# Patient Record
Sex: Male | Born: 1980 | Race: White | Hispanic: No | Marital: Married | Smoking: Former smoker
Health system: Southern US, Community
[De-identification: ages and names within clinical notes are randomized; demographics above are authoritative.]

## PROBLEM LIST (undated history)

## (undated) DIAGNOSIS — R109 Unspecified abdominal pain: Secondary | ICD-10-CM

## (undated) DIAGNOSIS — F419 Anxiety disorder, unspecified: Secondary | ICD-10-CM

## (undated) DIAGNOSIS — R51 Headache: Secondary | ICD-10-CM

## (undated) DIAGNOSIS — F329 Major depressive disorder, single episode, unspecified: Secondary | ICD-10-CM

## (undated) DIAGNOSIS — G8929 Other chronic pain: Secondary | ICD-10-CM

## (undated) DIAGNOSIS — R079 Chest pain, unspecified: Secondary | ICD-10-CM

## (undated) DIAGNOSIS — N200 Calculus of kidney: Secondary | ICD-10-CM

## (undated) DIAGNOSIS — R202 Paresthesia of skin: Secondary | ICD-10-CM

## (undated) DIAGNOSIS — R519 Headache, unspecified: Secondary | ICD-10-CM

## (undated) DIAGNOSIS — I10 Essential (primary) hypertension: Secondary | ICD-10-CM

## (undated) DIAGNOSIS — F32A Depression, unspecified: Secondary | ICD-10-CM

## (undated) DIAGNOSIS — K219 Gastro-esophageal reflux disease without esophagitis: Secondary | ICD-10-CM

## (undated) HISTORY — DX: Calculus of kidney: N20.0

## (undated) HISTORY — DX: Essential (primary) hypertension: I10

---

## 2013-05-09 DIAGNOSIS — R079 Chest pain, unspecified: Secondary | ICD-10-CM

## 2013-05-23 DIAGNOSIS — R079 Chest pain, unspecified: Secondary | ICD-10-CM

## 2013-05-28 ENCOUNTER — Emergency Department (HOSPITAL_COMMUNITY): Payer: BC Managed Care – PPO

## 2013-05-28 ENCOUNTER — Encounter (HOSPITAL_COMMUNITY): Payer: Self-pay

## 2013-05-28 ENCOUNTER — Emergency Department (HOSPITAL_COMMUNITY)
Admission: EM | Admit: 2013-05-28 | Discharge: 2013-05-29 | Disposition: A | Discharge status: Home / Self Care | Payer: BC Managed Care – PPO | Attending: Emergency Medicine | Admitting: Emergency Medicine

## 2013-05-28 DIAGNOSIS — F419 Anxiety disorder, unspecified: Secondary | ICD-10-CM

## 2013-05-28 DIAGNOSIS — Z791 Long term (current) use of non-steroidal anti-inflammatories (NSAID): Secondary | ICD-10-CM | POA: Insufficient documentation

## 2013-05-28 DIAGNOSIS — Z79899 Other long term (current) drug therapy: Secondary | ICD-10-CM | POA: Insufficient documentation

## 2013-05-28 DIAGNOSIS — Z7982 Long term (current) use of aspirin: Secondary | ICD-10-CM | POA: Insufficient documentation

## 2013-05-28 DIAGNOSIS — F411 Generalized anxiety disorder: Secondary | ICD-10-CM | POA: Insufficient documentation

## 2013-05-28 DIAGNOSIS — F17200 Nicotine dependence, unspecified, uncomplicated: Secondary | ICD-10-CM | POA: Insufficient documentation

## 2013-05-28 LAB — CBC
HCT: 47 % (ref 39.0–52.0)
Hemoglobin: 16.5 g/dL (ref 13.0–17.0)
MCH: 30.7 pg (ref 26.0–34.0)
MCV: 87.4 fL (ref 78.0–100.0)
Platelets: 263 10*3/uL (ref 150–400)
RBC: 5.38 MIL/uL (ref 4.22–5.81)

## 2013-05-28 LAB — BASIC METABOLIC PANEL
BUN: 19 mg/dL (ref 6–23)
CO2: 27 mEq/L (ref 19–32)
Calcium: 9.4 mg/dL (ref 8.4–10.5)
Chloride: 105 mEq/L (ref 96–112)
Creatinine, Ser: 0.96 mg/dL (ref 0.50–1.35)
Glucose, Bld: 115 mg/dL — ABNORMAL HIGH (ref 70–99)

## 2013-05-28 MED ORDER — LORAZEPAM 1 MG PO TABS
1.0000 mg | ORAL_TABLET | Freq: Once | ORAL | Status: AC
  Administered 2013-05-28: 1 mg via ORAL
  Filled 2013-05-28: qty 1

## 2013-05-28 MED ORDER — LORAZEPAM 1 MG PO TABS: 0.5000 mg | ORAL_TABLET | Freq: Three times a day (TID) | ORAL | Status: DC | PRN

## 2013-05-28 NOTE — ED Notes (Signed)
Pt c/o generalized chest pain x1 month, pt reports the pain has now moved to his Left chest and radiates through to his back. Pt denies N/V, cough, congestion, diaphoresis, SOB, lightheaded, or weakness. Pt has been at [facility] several times for the same and has had a full work up and a stress test done on Friday is waiting for the results from his stress test

## 2013-05-28 NOTE — ED Provider Notes (Signed)
CSN: [id number]     Arrival date & time 05/28/13  1847 History     First MD Initiated Contact with Patient 05/28/13 2200     Chief Complaint  Patient presents with  . Chest Pain   (Consider location/radiation/quality/duration/timing/severity/associated sxs/prior Treatment) HPI  Marco Martinez is a 32 y.o.male without any significant PMH presents to the ER with complaints of intermittent chest pains. The pains have been going on for 1 months and are always changing location. The common the last couple of seconds and go away. He has been seen at [facility] multiple times for the same and they keep telling him he has acid reflux. A couple days ago they admitted him and did a stress test but he has not gotten the results back. He supposed to get back tomorrow. He decided to come to [facility] today because he feels as though Marco Martinez is not giving him any information. After speaking with the patient he admits that he is under a severe amount of stress. His father has died, his mom is sick, he's got a new baby in the wife to take care of, his foot hurts, he works a lot. The patient says sometimes he gets heart palpitations when he gets really anxious about everything. He does not feel like he has anyone to talk to. Denies being homicidal or suicidal. He admits that the pain calms mainly when he's laying down to go to sleep and starts thinking about things. He denies nausea, vomiting, diarrhea, congestion, wheezing, shortness of breath, weakness, dizziness.   History reviewed. No pertinent past medical history. History reviewed. No pertinent past surgical history. History reviewed. No pertinent family history. History  Substance Use Topics  . Smoking status: Current Every Day Smoker  . Smokeless tobacco: Not on file  . Alcohol Use: No    Review of Systems ROS is negative unless otherwise stated in the HPI  Allergies  Review of patient's allergies indicates no known allergies.  Home  Medications   Current Outpatient Rx  Name  Route  Sig  Dispense  Refill  . aspirin 325 MG tablet   Oral   Take 325 mg by mouth daily.         Marland Kitchen aspirin 81 MG tablet   Oral   Take 81 mg by mouth once.          . naproxen sodium (ANAPROX) 220 MG tablet   Oral   Take 440 mg by mouth daily as needed (for pain).         . nicotine (NICODERM CQ - DOSED IN MG/24 HOURS) 21 mg/24hr patch   Transdermal   Place 1 patch onto the skin daily.         . nitroGLYCERIN (NITROGLYN) 2 % ointment   Transdermal   Place 0.5 inches onto the skin 3 (three) times daily.         Marland Kitchen omeprazole (PRILOSEC) 40 MG capsule   Oral   Take 40 mg by mouth daily.         Marland Kitchen PRESCRIPTION MEDICATION   Apply externally   Apply 1 patch topically once. Nitro Patch, unknown strength         . meloxicam (MOBIC) 15 MG tablet   Oral   Take 15 mg by mouth daily.          BP 126/74  Pulse 77  Temp(Src) 98.2 F (36.8 C) (Oral)  Resp 21  Ht 6\' 1"  (1.854 m)  Wt 249 lb (  112.946 kg)  BMI 32.86 kg/m2  SpO2 100% Physical Exam  Nursing note and vitals reviewed. Constitutional: He appears well-developed and well-nourished. No distress.  HENT:  Head: Normocephalic and atraumatic.  Eyes: Pupils are equal, round, and reactive to light.  Neck: Normal range of motion. Neck supple.  Cardiovascular: Normal rate and regular rhythm.   Pulmonary/Chest: Effort normal. He exhibits no tenderness, no bony tenderness, no deformity and no swelling.  Abdominal: Soft.  Neurological: He is alert.  Skin: Skin is warm and dry.  Psychiatric: His mood appears anxious. He expresses no homicidal and no suicidal ideation.    ED Course   Procedures (including critical care time)  Labs Reviewed  CBC - Abnormal; Notable for the following:    WBC 10.9 (*)    All other components within normal limits  BASIC METABOLIC PANEL - Abnormal; Notable for the following:    Glucose, Bld 115 (*)    All other components within  normal limits  POCT I-STAT TROPONIN I   Dg Chest 2 View  05/28/2013   *RADIOLOGY REPORT*  Clinical Data: Chest discomfort for 1 month.  Smoker.  CHEST - 2 VIEW  Comparison: 05/23/13 from [facility]  Findings: Lateral view degraded by patient arm position.  Midline trachea.  Normal heart size and mediastinal contours. No pleural effusion or pneumothorax.  Mild low lung volumes. Clear lungs.  IMPRESSION: No acute cardiopulmonary disease.   Original Report Authenticated By: Jeronimo Greaves, M.D.   1. Anxiety     MDM   Date: 05/28/2013  Rate: 85  Rhythm: normal sinus rhythm  QRS Axis: right  Intervals: normal  ST/T Wave abnormalities: normal  Conduction Disutrbances:none  Narrative Interpretation:   Old EKG Reviewed: unchanged  Patient 1 mg of Ativan he said that the sensation in his chest went away. Patient is PERC negative and possible PE was considered but not likely. Pt now feels  that it is probably stress.  Due to patient significant amount of stress and feeling of omeprazole for GERD and recent stress test, I question whether this is anxiety related.  He will call to find out results of the stress test tomorrow. I will give him a referral to cardiology in referrals for a very health resources in [place]. Him and his wife are very comfortable with plan.  Dorthula Matas, PA-C 05/28/13 2356

## 2013-05-29 NOTE — ED Provider Notes (Signed)
Medical screening examination/treatment/procedure(s) were performed by non-physician practitioner and as supervising physician I was immediately available for consultation/collaboration.  Emmaclaire Switala M Makai Dumond, MD 05/29/13 0018 

## 2013-06-15 ENCOUNTER — Encounter: Payer: Self-pay | Admitting: *Deleted

## 2013-06-18 ENCOUNTER — Emergency Department (HOSPITAL_COMMUNITY)
Admission: EM | Admit: 2013-06-18 | Discharge: 2013-06-18 | Disposition: A | Discharge status: Home / Self Care | Payer: BC Managed Care – PPO | Attending: Emergency Medicine | Admitting: Emergency Medicine

## 2013-06-18 ENCOUNTER — Emergency Department (HOSPITAL_COMMUNITY): Payer: BC Managed Care – PPO

## 2013-06-18 ENCOUNTER — Encounter (HOSPITAL_COMMUNITY): Payer: Self-pay | Admitting: *Deleted

## 2013-06-18 DIAGNOSIS — M25579 Pain in unspecified ankle and joints of unspecified foot: Secondary | ICD-10-CM | POA: Insufficient documentation

## 2013-06-18 DIAGNOSIS — Z791 Long term (current) use of non-steroidal anti-inflammatories (NSAID): Secondary | ICD-10-CM | POA: Insufficient documentation

## 2013-06-18 DIAGNOSIS — M542 Cervicalgia: Secondary | ICD-10-CM | POA: Insufficient documentation

## 2013-06-18 DIAGNOSIS — R209 Unspecified disturbances of skin sensation: Secondary | ICD-10-CM | POA: Insufficient documentation

## 2013-06-18 DIAGNOSIS — Z7982 Long term (current) use of aspirin: Secondary | ICD-10-CM | POA: Insufficient documentation

## 2013-06-18 DIAGNOSIS — Z87828 Personal history of other (healed) physical injury and trauma: Secondary | ICD-10-CM | POA: Insufficient documentation

## 2013-06-18 DIAGNOSIS — F17200 Nicotine dependence, unspecified, uncomplicated: Secondary | ICD-10-CM | POA: Insufficient documentation

## 2013-06-18 DIAGNOSIS — M25561 Pain in right knee: Secondary | ICD-10-CM

## 2013-06-18 DIAGNOSIS — M25569 Pain in unspecified knee: Secondary | ICD-10-CM | POA: Insufficient documentation

## 2013-06-18 DIAGNOSIS — Z79899 Other long term (current) drug therapy: Secondary | ICD-10-CM | POA: Insufficient documentation

## 2013-06-18 DIAGNOSIS — R6889 Other general symptoms and signs: Secondary | ICD-10-CM | POA: Insufficient documentation

## 2013-06-18 MED ORDER — TRAMADOL HCL 50 MG PO TABS: 50.0000 mg | ORAL_TABLET | Freq: Four times a day (QID) | ORAL | Status: DC | PRN
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
## 2013-06-18 NOTE — ED Notes (Signed)
Pt alert & oriented x4, stable gait. Patient given discharge instructions, paperwork & prescription(s). Patient  instructed to stop at the registration desk to finish any additional paperwork. Patient verbalized understanding. Pt left department w/ no further questions. 
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
## 2013-06-18 NOTE — ED Notes (Addendum)
Pain lt chest for several mos and has had stress tests , ekg, blood tests and could not find anything wrong. Started  ativan for anxiety. And med for reflux and aspirin.  For last 2 days has had numb sensation in back of neck and in rt leg. And back of head.

## 2013-06-22 ENCOUNTER — Encounter: Payer: Self-pay | Admitting: Cardiology

## 2013-06-22 ENCOUNTER — Emergency Department (HOSPITAL_COMMUNITY)
Admission: EM | Admit: 2013-06-22 | Discharge: 2013-06-22 | Disposition: A | Discharge status: Home / Self Care | Payer: BC Managed Care – PPO | Attending: Emergency Medicine | Admitting: Emergency Medicine

## 2013-06-22 ENCOUNTER — Encounter (HOSPITAL_COMMUNITY): Payer: Self-pay | Admitting: *Deleted

## 2013-06-22 ENCOUNTER — Other Ambulatory Visit: Payer: Self-pay

## 2013-06-22 DIAGNOSIS — Z7982 Long term (current) use of aspirin: Secondary | ICD-10-CM | POA: Insufficient documentation

## 2013-06-22 DIAGNOSIS — Z791 Long term (current) use of non-steroidal anti-inflammatories (NSAID): Secondary | ICD-10-CM | POA: Insufficient documentation

## 2013-06-22 DIAGNOSIS — Z79899 Other long term (current) drug therapy: Secondary | ICD-10-CM | POA: Insufficient documentation

## 2013-06-22 DIAGNOSIS — F411 Generalized anxiety disorder: Secondary | ICD-10-CM | POA: Insufficient documentation

## 2013-06-22 DIAGNOSIS — K219 Gastro-esophageal reflux disease without esophagitis: Secondary | ICD-10-CM | POA: Insufficient documentation

## 2013-06-22 DIAGNOSIS — F17200 Nicotine dependence, unspecified, uncomplicated: Secondary | ICD-10-CM | POA: Insufficient documentation

## 2013-06-22 DIAGNOSIS — F419 Anxiety disorder, unspecified: Secondary | ICD-10-CM

## 2013-06-22 DIAGNOSIS — M549 Dorsalgia, unspecified: Secondary | ICD-10-CM | POA: Insufficient documentation

## 2013-06-22 MED ORDER — LORAZEPAM 1 MG PO TABS: 1.0000 mg | ORAL_TABLET | Freq: Two times a day (BID) | ORAL | Status: DC

## 2013-06-22 MED ORDER — LORAZEPAM 1 MG PO TABS
1.0000 mg | ORAL_TABLET | Freq: Once | ORAL | Status: AC
  Administered 2013-06-22: 1 mg via ORAL
  Filled 2013-06-22: qty 1
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
## 2013-06-22 NOTE — ED Notes (Signed)
Patient presents with c/o intermittent chest and back pain x 2 months. Describes chest pain as a burning sensation. Reports that he has been seen for this several times over the past 2 months by various providers for the same. Cardiac origin of CP has been ruled out through blood work and a negative stress test. Patient was told that the cause of his pain was d/t reflux and depression. Omeprazole and zantac prescribed by PCP. [facility] prescribed Prozac which he just started today. Patient had been on Lorazepam as well but d/c'd by mental health and replaced with Hydroxyzine. Patient states that the combination of omeprazole, zantac and lorazepam relieved the pain. Since being off the lorazepam, the pain has returned. Patient states the pain is intermittent and episodic; only lasts for a few minutes. Denies SOB, diaphoresis, dizziness, headache, nausea, vomiting, abdominal pain or urinary symptoms. Patient AAOx4, resp e/u, VSS and in NAD.

## 2013-07-02 ENCOUNTER — Encounter: Payer: Self-pay | Admitting: Cardiology

## 2013-07-02 DIAGNOSIS — R079 Chest pain, unspecified: Secondary | ICD-10-CM

## 2013-07-07 ENCOUNTER — Encounter (HOSPITAL_COMMUNITY): Payer: Self-pay | Admitting: *Deleted

## 2013-07-07 ENCOUNTER — Emergency Department (HOSPITAL_COMMUNITY)
Admission: EM | Admit: 2013-07-07 | Discharge: 2013-07-07 | Disposition: A | Discharge status: Home / Self Care | Payer: BC Managed Care – PPO | Attending: Emergency Medicine | Admitting: Emergency Medicine

## 2013-07-07 DIAGNOSIS — R079 Chest pain, unspecified: Secondary | ICD-10-CM | POA: Insufficient documentation

## 2013-07-07 DIAGNOSIS — R1011 Right upper quadrant pain: Secondary | ICD-10-CM | POA: Insufficient documentation

## 2013-07-07 DIAGNOSIS — R109 Unspecified abdominal pain: Secondary | ICD-10-CM

## 2013-07-07 DIAGNOSIS — Z7982 Long term (current) use of aspirin: Secondary | ICD-10-CM | POA: Insufficient documentation

## 2013-07-07 DIAGNOSIS — F17200 Nicotine dependence, unspecified, uncomplicated: Secondary | ICD-10-CM | POA: Insufficient documentation

## 2013-07-07 DIAGNOSIS — R0789 Other chest pain: Secondary | ICD-10-CM

## 2013-07-07 DIAGNOSIS — Z79899 Other long term (current) drug therapy: Secondary | ICD-10-CM | POA: Insufficient documentation

## 2013-07-07 DIAGNOSIS — K219 Gastro-esophageal reflux disease without esophagitis: Secondary | ICD-10-CM | POA: Insufficient documentation

## 2013-07-07 DIAGNOSIS — F411 Generalized anxiety disorder: Secondary | ICD-10-CM | POA: Insufficient documentation

## 2013-07-07 DIAGNOSIS — F3289 Other specified depressive episodes: Secondary | ICD-10-CM | POA: Insufficient documentation

## 2013-07-07 DIAGNOSIS — R197 Diarrhea, unspecified: Secondary | ICD-10-CM | POA: Insufficient documentation

## 2013-07-07 DIAGNOSIS — M549 Dorsalgia, unspecified: Secondary | ICD-10-CM | POA: Insufficient documentation

## 2013-07-07 DIAGNOSIS — F329 Major depressive disorder, single episode, unspecified: Secondary | ICD-10-CM | POA: Insufficient documentation

## 2013-07-07 HISTORY — DX: Depression, unspecified: F32.A

## 2013-07-07 HISTORY — DX: Major depressive disorder, single episode, unspecified: F32.9

## 2013-07-07 HISTORY — DX: Gastro-esophageal reflux disease without esophagitis: K21.9

## 2013-07-07 HISTORY — DX: Anxiety disorder, unspecified: F41.9

## 2013-07-07 LAB — URINALYSIS, ROUTINE W REFLEX MICROSCOPIC
Nitrite: NEGATIVE
Specific Gravity, Urine: 1.02 (ref 1.005–1.030)
Urobilinogen, UA: 0.2 mg/dL (ref 0.0–1.0)

## 2013-07-07 LAB — COMPREHENSIVE METABOLIC PANEL
Alkaline Phosphatase: 59 U/L (ref 39–117)
BUN: 21 mg/dL (ref 6–23)
Calcium: 9.3 mg/dL (ref 8.4–10.5)
Creatinine, Ser: 1.02 mg/dL (ref 0.50–1.35)
GFR calc Af Amer: >90 mL/min (ref >90)
Glucose, Bld: 126 mg/dL — ABNORMAL HIGH (ref 70–99)
Total Protein: 7.3 g/dL (ref 6.0–8.3)

## 2013-07-07 LAB — CBC
HCT: 45.1 % (ref 39.0–52.0)
Hemoglobin: 15.5 g/dL (ref 13.0–17.0)
MCH: 29.5 pg (ref 26.0–34.0)
MCHC: 34.4 g/dL (ref 30.0–36.0)

## 2013-07-07 LAB — LIPASE, BLOOD: Lipase: 35 U/L (ref 11–59)

## 2013-07-07 MED ORDER — FAMOTIDINE 20 MG PO TABS
20.0000 mg | ORAL_TABLET | Freq: Once | ORAL | Status: AC
  Administered 2013-07-07: 20 mg via ORAL
  Filled 2013-07-07: qty 1

## 2013-07-07 MED ORDER — RANITIDINE HCL 150 MG PO CAPS: 150.0000 mg | ORAL_CAPSULE | Freq: Every day | ORAL | Status: DC

## 2013-07-07 NOTE — ED Notes (Signed)
Intermittent RUQ pain radiating to back w/increased belching.  C/O L chest burning.

## 2013-07-07 NOTE — ED Provider Notes (Signed)
CSN: [id number]     Arrival date & time 07/07/13  1744 History  This chart was scribed for Audree Camel, MD by Henri Medal, ED Scribe. This patient was seen in room APA16A/APA16A and the patient's care was started at 6:17 PM.    Chief Complaint  Patient presents with  . Abdominal Pain  . Chest Pain   The history is provided by the patient. No language interpreter was used.   HPI Comments: Marco Martinez is a 32 y.o. male who presents to the Emergency Department complaining of intemittent, "burniing" RUQ abdominal and chest pain over the past 3 months. He also reports associated increased belching, and radiation of abdominal pain to his right ribs as an "aching" pain and to his right back as a "stabbing" pain onset 2 days ago. He states that his pain worsened after drinking Sprite yesterday, and again after drinking orange soda today. Pt states that he was admitted last week for a flare-up of this pain onset after eating a hot dog. He states that he has had normal EKGs and normal stress tests, and he does not believe his pain is related to his heart. Pt has taken both Nexium and Prilosec without relief. Pt has taken milk of Mg which he believes may have caused diarrhea, but he also states this may be related to recent Penicillin use. Pt denies fever, chills, leg pain and swelling, nausea, emesis, constipation, dysuria, cough, SOB or any other symptoms. Pt reports he had a mild cold last week.    Past Medical History  Diagnosis Date  . GERD (gastroesophageal reflux disease)   . Anxiety   . Depression    Past Surgical History  Procedure Laterality Date  . Skin surgery on right foot     Family History  Problem Relation Age of Onset  . Bladder Cancer Father   . Hypertension Mother    History  Substance Use Topics  . Smoking status: Current Every Day Smoker -- 0.50 packs/day for 5 years    Types: Cigarettes  . Smokeless tobacco: Not on file  . Alcohol Use: No    Review of Systems   Constitutional: Negative for fever and chills.  Respiratory: Negative for cough and shortness of breath.   Cardiovascular: Positive for chest pain. Negative for leg swelling.  Gastrointestinal: Positive for abdominal pain and diarrhea. Negative for nausea, vomiting and constipation.  Genitourinary: Negative for dysuria.  Musculoskeletal: Positive for back pain.  All other systems reviewed and are negative.    Allergies  Review of patient's allergies indicates no known allergies.  Home Medications   Current Outpatient Rx  Name  Route  Sig  Dispense  Refill  . aspirin 325 MG tablet   Oral   Take 325 mg by mouth daily.         Marland Kitchen aspirin 81 MG tablet   Oral   Take 81 mg by mouth once.          Marland Kitchen FLUoxetine (PROZAC) 20 MG capsule   Oral   Take 20 mg by mouth daily.         . hydrOXYzine (VISTARIL) 25 MG capsule   Oral   Take 25 mg by mouth 3 (three) times daily as needed for itching.         Marland Kitchen LORazepam (ATIVAN) 1 MG tablet   Oral   Take 0.5-1 tablets (0.5-1 mg total) by mouth every 8 (eight) hours as needed for anxiety.   15 tablet  0   . LORazepam (ATIVAN) 1 MG tablet   Oral   Take 1 tablet (1 mg total) by mouth 2 (two) times daily.   30 tablet   0   . meloxicam (MOBIC) 15 MG tablet   Oral   Take 15 mg by mouth daily.         . naproxen sodium (ANAPROX) 220 MG tablet   Oral   Take 440 mg by mouth daily as needed (for pain).         Marland Kitchen omeprazole (PRILOSEC) 40 MG capsule   Oral   Take 40 mg by mouth daily.         . traMADol (ULTRAM) 50 MG tablet   Oral   Take 1 tablet (50 mg total) by mouth every 6 (six) hours as needed for pain.   15 tablet   0    Triage Vitals: BP 137/95  Pulse 70  Temp(Src) 98.8 F (37.1 C) (Oral)  Resp 17  Ht 6\' 1"  (1.854 m)  Wt 229 lb (103.874 kg)  BMI 30.22 kg/m2  SpO2 98%  Physical Exam  Nursing note and vitals reviewed. Constitutional: He is oriented to person, place, and time. He appears well-developed  and well-nourished. No distress.  HENT:  Head: Normocephalic and atraumatic.  Eyes: EOM are normal.  Neck: Neck supple. No tracheal deviation present.  Cardiovascular: Normal rate and regular rhythm.   Pulmonary/Chest: Effort normal and breath sounds normal. No respiratory distress. He exhibits tenderness.  Left anterior chest tenderness.  Abdominal: Soft. Bowel sounds are normal. There is no tenderness.  Genitourinary:  No flank tenderness.  Musculoskeletal: Normal range of motion.  No back or leg tenderness.  Neurological: He is alert and oriented to person, place, and time.  Skin: Skin is warm and dry.  Psychiatric: He has a normal mood and affect. His behavior is normal.    ED Course  Procedures (including critical care time)  DIAGNOSTIC STUDIES: Oxygen Saturation is 98% on room air, normal by my interpretation.    COORDINATION OF CARE: 6:25 PM-Discussed treatment plan which includes UA and CBC to rule out gallbladder disease. Will order Pepcid. Advised pt to try Zantac for relief of symptoms, and that he should not be taking both Nexium and Prilosec. Pt understands and agrees with treatment plan.  Medications  famotidine (PEPCID) tablet 20 mg (20 mg Oral Given 07/07/13 1832)    Labs Review Labs Reviewed  COMPREHENSIVE METABOLIC PANEL - Abnormal; Notable for the following:    Glucose, Bld 126 (*)    All other components within normal limits  URINALYSIS, ROUTINE W REFLEX MICROSCOPIC - Abnormal; Notable for the following:    Bilirubin Urine SMALL (*)    Ketones, ur TRACE (*)    All other components within normal limits  CBC  LIPASE, BLOOD    Date: 07/07/2013  Rate: 73  Rhythm: normal sinus rhythm  QRS Axis: normal  Intervals: normal  ST/T Wave abnormalities: normal  Conduction Disutrbances:none  Narrative Interpretation: Normal EKG  Old EKG Reviewed: unchanged   Imaging Review No results found.  MDM   1. Abdominal pain   2. Burning chest pain    Patient  with recurrent chest pain/burning. This is most c/w a GI etiology, has had extensive cardiac w/u that is neg. EKG unchanged and not concerning. Abd exam benign, patient states his prior RUQ abd pain is gone. LFTs and Lipase normal. Feel he is stable for outpatient w/u of abd pain with possible outpatient  u/s. He is currently taking two PPIs, I recommend he discontinue one and start on an H2 blocker.   I personally performed the services described in this documentation, which was scribed in my presence. The recorded information has been reviewed and is accurate.    Audree Camel, MD 07/08/13 [phone]

## 2013-07-10 ENCOUNTER — Encounter: Payer: Self-pay | Admitting: Cardiology

## 2013-07-16 ENCOUNTER — Ambulatory Visit (INDEPENDENT_AMBULATORY_CARE_PROVIDER_SITE_OTHER): Payer: BC Managed Care – PPO | Admitting: Cardiology

## 2013-07-16 ENCOUNTER — Encounter: Payer: Self-pay | Admitting: Cardiology

## 2013-07-16 VITALS — BP 154/83 | HR 76 | Ht 73.0 in | Wt 228.0 lb

## 2013-07-16 DIAGNOSIS — R072 Precordial pain: Secondary | ICD-10-CM | POA: Insufficient documentation

## 2013-07-16 DIAGNOSIS — F17200 Nicotine dependence, unspecified, uncomplicated: Secondary | ICD-10-CM

## 2013-07-16 DIAGNOSIS — Z720 Tobacco use: Secondary | ICD-10-CM | POA: Insufficient documentation

## 2013-07-16 DIAGNOSIS — R030 Elevated blood-pressure reading, without diagnosis of hypertension: Secondary | ICD-10-CM

## 2013-07-16 NOTE — Assessment & Plan Note (Signed)
Noted on today's visit, no baseline history of hypertension. I recommended that he follow up with primary care provider.

## 2013-07-16 NOTE — Assessment & Plan Note (Signed)
Smoking cessation indicated as well.

## 2013-07-16 NOTE — Patient Instructions (Signed)
Your physician has requested that you have an echocardiogram. Echocardiography is a painless test that uses sound waves to create images of your heart. It provides your doctor with information about the size and shape of your heart and how well your heart's chambers and valves are working. This procedure takes approximately one hour. There are no restrictions for this procedure. Office will contact with results via phone or letter.   Follow up as needed

## 2013-07-16 NOTE — Assessment & Plan Note (Signed)
Very atypical for ischemic cardiac etiology. ECGs reviewed without significant ST segment abnormalities, and recent Lexiscan Cardiolite was low risk without obvious ischemia. Symptoms could be inflammatory based on reported prompt response to Toradol at recent ER visit. Incidentally noted also is wedging of a mid thoracic vertebral body by chest x-ray, question a neuropathic component as well. He does not report any improvement on PPI, also no significant change on Prozac and Ativan. I do not appreciate any pericardial rub on examination - an echocardiogram will be obtained to definitively clarify whether there is any pericardial fluid of significance, or any other cardiac structural abnormalities including assessment of aortic root. If this study is reassuring, no further cardiac testing is planned at this point. I did indicate that he will need close followup with his primary care provider to investigate his symptoms further.

## 2013-07-16 NOTE — Progress Notes (Signed)
Clinical Summary Marco Martinez is a 32 y.o.male referred for cardiology consultation by Dr. Olena Leatherwood. Record review finds hospitalization at [facility] in early September with chest pain in the setting of suspected GERD. He ruled out for myocardial infarction, has been treated with PPI, also Prozac and Ativan.  Recent Lexiscan Cardiolite from August 1 showed no diagnostic ST segment changes, soft tissue attenuation without active ischemia, LVEF 59%. I reviewed the results of this with the patient and his wife today. Also reviewed ECGs - most recent tracing in September showing sinus rhythm with decreased R-wave progression.  Marco Martinez states she has had constant pressure-like and also dull discomfort, varying positions in his chest, over the last 4 months. No exacerbation by exercise. No fevers or chills, no cough. Feels sweaty sometimes with the symptoms. No palpitations or syncope. Of note he was just seen in the ER by Dr. Timmothy Euler on September 15, was treated with Toradol, and tells me that his symptoms completely resolved after this intervention. Starting to return today. Also given a prescription for Lortab. He has not yet seen Dr. Olena Leatherwood for followup.  Chest x-ray report from September 8 showed clear lung fields, normal heart size and pulmonary vascularity. No adenopathy. There is description of a stable mild anterior wedge wedging of a mid thoracic vertebral body.  Today we discussed his symptoms, very atypical for ischemic cardiac chest pain. Also reviewed the low risk Cardiolite results.   No Known Allergies  Current Outpatient Prescriptions  Medication Sig Dispense Refill  . aspirin EC 81 MG tablet Take 81 mg by mouth every other day.       Marland Kitchen HYDROcodone-acetaminophen (NORCO/VICODIN) 5-325 MG per tablet Take 1 tablet by mouth 2 (two) times daily.      Marland Kitchen LORazepam (ATIVAN) 1 MG tablet Take 0.5 mg by mouth as needed.       Marland Kitchen omeprazole (PRILOSEC) 40 MG capsule Take 40 mg by mouth daily.       . ranitidine (ZANTAC) 150 MG capsule Take 1 capsule (150 mg total) by mouth daily.  30 capsule  0   No current facility-administered medications for this visit.    Past Medical History  Diagnosis Date  . GERD (gastroesophageal reflux disease)   . Anxiety   . Depression     Past Surgical History  Procedure Laterality Date  . Skin surgery on right foot      Family History  Problem Relation Age of Onset  . Bladder Cancer Father   . Hypertension Mother     Social History Marco Martinez reports that he has been smoking Cigarettes.  He has a 2.5 pack-year smoking history. He does not have any smokeless tobacco history on file. Marco Martinez reports that he does not drink alcohol.  Review of Systems Negative except as outlined above.  Physical Examination Filed Vitals:   07/16/13 0841  BP: 154/83  Pulse: 76   Filed Weights   07/16/13 0841  Weight: 228 lb (103.42 kg)   Appears comfortable. HEENT: Conjunctiva and lids normal, oropharynx clear. Neck: Supple, no elevated JVP or carotid bruits, no thyromegaly. Lungs: Clear to auscultation, nonlabored breathing at rest. Cardiac: Regular rate and rhythm, no S3 or significant systolic murmur, no pericardial rub. Abdomen: Soft, nontender, no hepatomegaly, bowel sounds present, no guarding or rebound. Extremities: No pitting edema, distal pulses 2+. Skin: Warm and dry. Musculoskeletal: No kyphosis. Neuropsychiatric: Alert and oriented x3, affect grossly appropriate.   Problem List and Plan   Precordial pain Very  atypical for ischemic cardiac etiology. ECGs reviewed without significant ST segment abnormalities, and recent Lexiscan Cardiolite was low risk without obvious ischemia. Symptoms could be inflammatory based on reported prompt response to Toradol at recent ER visit. Incidentally noted also is wedging of a mid thoracic vertebral body by chest x-ray, question a neuropathic component as well. He does not report any improvement on  PPI, also no significant change on Prozac and Ativan. I do not appreciate any pericardial rub on examination - an echocardiogram will be obtained to definitively clarify whether there is any pericardial fluid of significance, or any other cardiac structural abnormalities including assessment of aortic root. If this study is reassuring, no further cardiac testing is planned at this point. I did indicate that he will need close followup with his primary care provider to investigate his symptoms further.  Elevated blood pressure Noted on today's visit, no baseline history of hypertension. I recommended that he follow up with primary care provider.  Tobacco abuse Smoking cessation indicated as well.    Jonelle Sidle, M.D., F.A.C.C.

## 2013-07-19 ENCOUNTER — Ambulatory Visit: Payer: BC Managed Care – PPO | Admitting: Cardiology

## 2013-07-21 ENCOUNTER — Emergency Department (HOSPITAL_COMMUNITY)
Admission: EM | Admit: 2013-07-21 | Discharge: 2013-07-21 | Disposition: A | Discharge status: Home / Self Care | Payer: Medicaid Other | Attending: Emergency Medicine | Admitting: Emergency Medicine

## 2013-07-21 ENCOUNTER — Encounter (HOSPITAL_COMMUNITY): Payer: Self-pay | Admitting: Emergency Medicine

## 2013-07-21 ENCOUNTER — Emergency Department (HOSPITAL_COMMUNITY): Payer: Medicaid Other

## 2013-07-21 DIAGNOSIS — F17200 Nicotine dependence, unspecified, uncomplicated: Secondary | ICD-10-CM | POA: Insufficient documentation

## 2013-07-21 DIAGNOSIS — F3289 Other specified depressive episodes: Secondary | ICD-10-CM | POA: Insufficient documentation

## 2013-07-21 DIAGNOSIS — R519 Headache, unspecified: Secondary | ICD-10-CM

## 2013-07-21 DIAGNOSIS — R0789 Other chest pain: Secondary | ICD-10-CM

## 2013-07-21 DIAGNOSIS — Z79899 Other long term (current) drug therapy: Secondary | ICD-10-CM | POA: Insufficient documentation

## 2013-07-21 DIAGNOSIS — R059 Cough, unspecified: Secondary | ICD-10-CM | POA: Insufficient documentation

## 2013-07-21 DIAGNOSIS — M79609 Pain in unspecified limb: Secondary | ICD-10-CM | POA: Insufficient documentation

## 2013-07-21 DIAGNOSIS — K219 Gastro-esophageal reflux disease without esophagitis: Secondary | ICD-10-CM | POA: Insufficient documentation

## 2013-07-21 DIAGNOSIS — R51 Headache: Secondary | ICD-10-CM | POA: Insufficient documentation

## 2013-07-21 DIAGNOSIS — F329 Major depressive disorder, single episode, unspecified: Secondary | ICD-10-CM | POA: Insufficient documentation

## 2013-07-21 DIAGNOSIS — F411 Generalized anxiety disorder: Secondary | ICD-10-CM | POA: Insufficient documentation

## 2013-07-21 DIAGNOSIS — R05 Cough: Secondary | ICD-10-CM | POA: Insufficient documentation

## 2013-07-21 LAB — CBC WITH DIFFERENTIAL/PLATELET
Basophils Relative: 0 % (ref 0–1)
Eosinophils Absolute: 0.2 10*3/uL (ref 0.0–0.7)
Lymphs Abs: 2.7 10*3/uL (ref 0.7–4.0)
MCH: 30 pg (ref 26.0–34.0)
Neutrophils Relative %: 64 % (ref 43–77)
Platelets: 253 10*3/uL (ref 150–400)
RBC: 4.94 MIL/uL (ref 4.22–5.81)

## 2013-07-21 LAB — COMPREHENSIVE METABOLIC PANEL
ALT: 25 U/L (ref 0–53)
AST: 19 U/L (ref 0–37)
Albumin: 4.2 g/dL (ref 3.5–5.2)
Alkaline Phosphatase: 54 U/L (ref 39–117)
Glucose, Bld: 146 mg/dL — ABNORMAL HIGH (ref 70–99)
Potassium: 3.2 mEq/L — ABNORMAL LOW (ref 3.5–5.1)
Sodium: 137 mEq/L (ref 135–145)
Total Protein: 6.9 g/dL (ref 6.0–8.3)

## 2013-07-21 NOTE — ED Notes (Signed)
Pt c/o headache x 1 month. Started getting numb to back of head x 2 days ago. Denies dizziness/trouble walking or swallowing/blurred vision. Pt c/o pressure to anterior lower right leg x 2 days now. Pt c/o sharp pains that rotates from right to left breast x 2 days now also. Nad. Alert/active.

## 2013-07-21 NOTE — ED Provider Notes (Signed)
Scribed for No att. providers found, the patient was seen in room APA19/APA19. This chart was scribed by Lewanda Rife, ED scribe. Patient's care was started at 1831  CSN: [id number]     Arrival date & time 07/21/13  1820 History   First MD Initiated Contact with Patient 07/21/13 1828     Chief Complaint  Patient presents with  . Numbness   (Consider location/radiation/quality/duration/timing/severity/associated sxs/prior Treatment) The history is provided by the patient.   HPI Comments: Marco Martinez is a 32 y.o. male who presents to the Emergency Department complaining of waxing and waning moderate chest pain onset 5 months. Describes pain as stabbing and episodes last 4-5 minutes over inferior to bilateral breasts. Reports associated occasional non-productive cough. Denies any aggravating or alleviating factors. Denies associated shortness of breath, visual disturbances, dysphagia, and fever. Reports frequent visits to the ED for chest pain, but different location. Negative stress test with cardiologist last week. Taking Norco for pain with mild relief.   Reports normal fluid and food intake.   Additionally, reports of waxing and waning moderate headaches onset gradual each day 1 month. Describes pain as numbness. Denies associated recent falls, and recent head injuries. Denies any aggravating or alleviating factors.   Additionally, reports anterior right lower leg pain onset 2 days. Denies associated recent fall, change in gait, and recent injuries.  Denies any aggravating or alleviating factors.    Past Medical History  Diagnosis Date  . GERD (gastroesophageal reflux disease)   . Anxiety   . Depression    History reviewed. No pertinent past surgical history. Family History  Problem Relation Age of Onset  . Bladder Cancer Father   . Hypertension Mother    History  Substance Use Topics  . Smoking status: Current Every Day Smoker -- 0.50 packs/day for 5 years    Types:  Cigarettes  . Smokeless tobacco: Not on file  . Alcohol Use: No    Review of Systems  Neurological: Positive for numbness.  All other systems reviewed and are negative.  A complete 10 system review of systems was obtained and all systems are negative except as noted in the HPI and PMH.     Allergies  Review of patient's allergies indicates no known allergies.  Home Medications   Current Outpatient Rx  Name  Route  Sig  Dispense  Refill  . aspirin EC 81 MG tablet   Oral   Take 81 mg by mouth every other day.          Marland Kitchen HYDROcodone-acetaminophen (NORCO/VICODIN) 5-325 MG per tablet   Oral   Take 1 tablet by mouth 2 (two) times daily.         Marland Kitchen LORazepam (ATIVAN) 1 MG tablet   Oral   Take 0.5 mg by mouth daily as needed for anxiety.          Marland Kitchen omeprazole (PRILOSEC) 40 MG capsule   Oral   Take 40 mg by mouth daily.         . ranitidine (ZANTAC) 150 MG capsule   Oral   Take 1 capsule (150 mg total) by mouth daily.   30 capsule   0    BP 145/85  Pulse 81  Temp(Src) 99.1 F (37.3 C) (Oral)  Resp 18  SpO2 97% Physical Exam  Nursing note and vitals reviewed. Constitutional: He is oriented to person, place, and time. He appears well-developed and well-nourished. No distress.  HENT:  Head: Normocephalic and atraumatic.  No step-offs,  no hematomas, no deformities.   Eyes: Conjunctivae and EOM are normal. Pupils are equal, round, and reactive to light. No scleral icterus.  Neck: Normal range of motion. Neck supple. No tracheal deviation present.  Cardiovascular: Normal rate, regular rhythm and intact distal pulses.   No murmur heard. Pulmonary/Chest: Effort normal and breath sounds normal. No stridor. No respiratory distress. He has no wheezes. He has no rales. He exhibits no tenderness and no bony tenderness.  Chest pain not reproducible   Abdominal: Soft.  Musculoskeletal: Normal range of motion. He exhibits no edema and no tenderness.       Right knee:  Normal. No tenderness found.  No proximal tibial tenderness.   Neurological: He is alert and oriented to person, place, and time. He has normal strength. No sensory deficit.  Normal gait without ataxia. CN 2-12 intact, no ataxia on finger to nose, no nystagmus, 5/5 strength throughout, no pronator drift, Romberg negative, normal gait.    Skin: Skin is warm and dry.  Psychiatric: He has a normal mood and affect. His behavior is normal.    ED Course  Procedures (including critical care time) Medications - No data to display  Labs Review Labs Reviewed  COMPREHENSIVE METABOLIC PANEL - Abnormal; Notable for the following:    Potassium 3.2 (*)    Glucose, Bld 146 (*)    All other components within normal limits  CBC WITH DIFFERENTIAL  TROPONIN I  D-DIMER, QUANTITATIVE   Imaging Review Dg Chest 2 View  07/21/2013   CLINICAL DATA:  Chest pain, leg pain  EXAM: CHEST  2 VIEW  COMPARISON:  07/02/2013  FINDINGS: Upper-normal size of cardiac silhouette.  Mediastinal contours and pulmonary vascularity normal.  Minimal peribronchial thickening.  Lungs otherwise clear.  No pleural effusion or pneumothorax.  Bones stable with mild chronic anterior height loss of a lower thoracic vertebra.  IMPRESSION: Minimal bronchitic changes without infiltrate.   Electronically Signed   By: Ulyses Southward M.D.   On: 07/21/2013 19:13   Dg Tibia/fibula Right  07/21/2013   CLINICAL DATA:  Chest pain, leg pain, remote fracture  EXAM: RIGHT TIBIA AND FIBULA - 2 VIEW  COMPARISON:  None  FINDINGS: Osseous mineralization normal.  Joint spaces preserved.  No fracture, dislocation, or bone destruction.  IMPRESSION: No acute osseous abnormalities.   Electronically Signed   By: Ulyses Southward M.D.   On: 07/21/2013 19:14   Ct Head Wo Contrast  07/21/2013   CLINICAL DATA:  Head numbness for several days. No injury.  EXAM: CT HEAD WITHOUT CONTRAST  TECHNIQUE: Contiguous axial images were obtained from the base of the skull through the  vertex without intravenous contrast.  COMPARISON:  None.  FINDINGS: The ventricles are normal in size and configuration.  There are no parenchymal masses or mass effect. There are no areas of abnormal parenchymal attenuation. There is no evidence of an infarct.  There are no extra-axial masses or abnormal fluid collections.  There is no intracranial hemorrhage.  The visualized sinuses and mastoid air cells are clear.  IMPRESSION: Normal unenhanced CT scan of the brain.   Electronically Signed   By: Amie Portland   On: 07/21/2013 19:07   Dg Knee Complete 4 Views Right  07/21/2013   CLINICAL DATA:  Leg pain, chest pain, prior fracture  EXAM: RIGHT KNEE - COMPLETE 4+ VIEW  COMPARISON:  [facility] 03/2007  FINDINGS: Bone mineralization normal.  Joint spaces preserved.  No fracture, dislocation, or bone destruction.  No joint  effusion.  IMPRESSION: No acute osseous abnormalities.   Electronically Signed   By: Ulyses Southward M.D.   On: 07/21/2013 19:15    MDM   1. Atypical chest pain   2. Headache    Patient presents with multiple complaints. He complains of a headache that has been daily for the past month that is gradual in onset. Denies any focal weakness, numbness or tingling. No trouble walking or swallowing.  His second complaint is ongoing chest pain for the past 5 months previous workups multiple times. He had negative stress test on August 1. He saw cardiology on September 22 and is scheduled for an echocardiogram this week. Patient's description of pain is atypical lasting a few seconds to minutes at a time and migrating across his chest. Low suspicion for ACS or PE.  EKG is normal sinus rhythm. Troponin is negative. CXR negative. X-rays were obtained to evaluate patient's right shin pain and negative.  Low suspicion for ACS or PE. Stable for outpatient follow up for echo as scheduled.    Date: 07/21/2013  Rate: 68  Rhythm: normal sinus rhythm  QRS Axis: normal  Intervals: normal  ST/T Wave  abnormalities: normal  Conduction Disutrbances:none  Narrative Interpretation:   Old EKG Reviewed: unchanged   I personally performed the services described in this documentation, which was scribed in my presence. The recorded information has been reviewed and is accurate.    Glynn Octave, MD 07/21/13 [phone]

## 2013-07-26 ENCOUNTER — Other Ambulatory Visit: Payer: Self-pay

## 2013-07-26 ENCOUNTER — Other Ambulatory Visit (INDEPENDENT_AMBULATORY_CARE_PROVIDER_SITE_OTHER): Payer: Medicaid Other

## 2013-07-26 DIAGNOSIS — R072 Precordial pain: Secondary | ICD-10-CM

## 2013-07-27 ENCOUNTER — Telehealth: Payer: Self-pay | Admitting: *Deleted

## 2013-07-27 NOTE — Telephone Encounter (Signed)
Message copied by Eustace Moore on Fri Jul 27, 2013  4:14 PM ------      Message from: Jonelle Sidle      Created: Fri Jul 27, 2013  8:07 AM       Reviewed report. Normal LVEF, normal aortic root size, no pericardial effusion. This is a reassuring study. No further cardiac workup planned at this time. ------

## 2013-07-27 NOTE — Telephone Encounter (Signed)
Patient informed. 

## 2013-11-23 ENCOUNTER — Emergency Department (HOSPITAL_COMMUNITY): Payer: Medicaid Other

## 2013-11-23 ENCOUNTER — Encounter (HOSPITAL_COMMUNITY): Payer: Self-pay | Admitting: Emergency Medicine

## 2013-11-23 ENCOUNTER — Emergency Department (HOSPITAL_COMMUNITY)
Admission: EM | Admit: 2013-11-23 | Discharge: 2013-11-23 | Disposition: A | Discharge status: Home / Self Care | Payer: Medicaid Other | Attending: Emergency Medicine | Admitting: Emergency Medicine

## 2013-11-23 DIAGNOSIS — Z79899 Other long term (current) drug therapy: Secondary | ICD-10-CM | POA: Insufficient documentation

## 2013-11-23 DIAGNOSIS — Z8659 Personal history of other mental and behavioral disorders: Secondary | ICD-10-CM | POA: Insufficient documentation

## 2013-11-23 DIAGNOSIS — R0602 Shortness of breath: Secondary | ICD-10-CM | POA: Insufficient documentation

## 2013-11-23 DIAGNOSIS — G8929 Other chronic pain: Secondary | ICD-10-CM | POA: Insufficient documentation

## 2013-11-23 DIAGNOSIS — F17200 Nicotine dependence, unspecified, uncomplicated: Secondary | ICD-10-CM | POA: Insufficient documentation

## 2013-11-23 DIAGNOSIS — R109 Unspecified abdominal pain: Secondary | ICD-10-CM | POA: Insufficient documentation

## 2013-11-23 DIAGNOSIS — K219 Gastro-esophageal reflux disease without esophagitis: Secondary | ICD-10-CM | POA: Insufficient documentation

## 2013-11-23 DIAGNOSIS — R071 Chest pain on breathing: Secondary | ICD-10-CM | POA: Insufficient documentation

## 2013-11-23 DIAGNOSIS — R209 Unspecified disturbances of skin sensation: Secondary | ICD-10-CM | POA: Insufficient documentation

## 2013-11-23 DIAGNOSIS — R0789 Other chest pain: Secondary | ICD-10-CM

## 2013-11-23 HISTORY — DX: Unspecified abdominal pain: R10.9

## 2013-11-23 HISTORY — DX: Other chronic pain: G89.29

## 2013-11-23 HISTORY — DX: Headache: R51

## 2013-11-23 HISTORY — DX: Paresthesia of skin: R20.2

## 2013-11-23 HISTORY — DX: Headache, unspecified: R51.9

## 2013-11-23 HISTORY — DX: Chest pain, unspecified: R07.9

## 2013-11-23 LAB — CBC WITH DIFFERENTIAL/PLATELET
BASOS ABS: 0 10*3/uL (ref 0.0–0.1)
BASOS PCT: 1 % (ref 0–1)
Eosinophils Absolute: 0.2 10*3/uL (ref 0.0–0.7)
Eosinophils Relative: 2 % (ref 0–5)
HCT: 44.6 % (ref 39.0–52.0)
Hemoglobin: 15.1 g/dL (ref 13.0–17.0)
LYMPHS PCT: 38 % (ref 12–46)
Lymphs Abs: 2.9 10*3/uL (ref 0.7–4.0)
MCH: 30.3 pg (ref 26.0–34.0)
MCHC: 33.9 g/dL (ref 30.0–36.0)
MCV: 89.4 fL (ref 78.0–100.0)
MONO ABS: 0.4 10*3/uL (ref 0.1–1.0)
Monocytes Relative: 5 % (ref 3–12)
NEUTROS ABS: 4.1 10*3/uL (ref 1.7–7.7)
NEUTROS PCT: 54 % (ref 43–77)
PLATELETS: 264 10*3/uL (ref 150–400)
RBC: 4.99 MIL/uL (ref 4.22–5.81)
RDW: 12.9 % (ref 11.5–15.5)
WBC: 7.6 10*3/uL (ref 4.0–10.5)

## 2013-11-23 LAB — LIPASE, BLOOD: Lipase: 39 U/L (ref 11–59)

## 2013-11-23 LAB — COMPREHENSIVE METABOLIC PANEL
ALBUMIN: 4 g/dL (ref 3.5–5.2)
ALT: 25 U/L (ref 0–53)
AST: 17 U/L (ref 0–37)
Alkaline Phosphatase: 60 U/L (ref 39–117)
BILIRUBIN TOTAL: 0.2 mg/dL — AB (ref 0.3–1.2)
BUN: 18 mg/dL (ref 6–23)
CHLORIDE: 104 meq/L (ref 96–112)
CO2: 25 meq/L (ref 19–32)
CREATININE: 0.77 mg/dL (ref 0.50–1.35)
Calcium: 8.9 mg/dL (ref 8.4–10.5)
GFR calc Af Amer: >90 mL/min (ref >90)
GFR calc non Af Amer: >90 mL/min (ref >90)
Glucose, Bld: 121 mg/dL — ABNORMAL HIGH (ref 70–99)
Potassium: 3.7 mEq/L (ref 3.7–5.3)
SODIUM: 142 meq/L (ref 137–147)
Total Protein: 7.1 g/dL (ref 6.0–8.3)

## 2013-11-23 LAB — TROPONIN I: Troponin I: <0.3 ng/mL (ref <0.30)

## 2013-11-23 MED ORDER — GI COCKTAIL ~~LOC~~
30.0000 mL | Freq: Once | ORAL | Status: AC
  Administered 2013-11-23: 30 mL via ORAL
  Filled 2013-11-23: qty 30

## 2013-11-23 MED ORDER — FAMOTIDINE 20 MG PO TABS
40.0000 mg | ORAL_TABLET | Freq: Once | ORAL | Status: AC
  Administered 2013-11-23: 40 mg via ORAL
  Filled 2013-11-23: qty 2

## 2013-11-23 NOTE — ED Provider Notes (Signed)
CSN: [id number]     Arrival date & time 11/23/13  1735 History   First MD Initiated Contact with Patient 11/23/13 1813     Chief Complaint  Patient presents with  . Chest Pain  . Abdominal Pain    HPI Pt was seen at 1820.  Per pt, c/o gradual onset and persistence of constant upper abd "pain" for the past 1 year.  Has been associated with chest "pain" and "feeling gassy." Symptoms have worse over the past 2 days. Describes the abd pain as "burning;" describes the CP as located in his mid-sternal area, "sharp" and "stabbing." States the pain is "always there" but "gets worse sometimes," which makes him feel SOB and all his fingertips "start to tingle." Denies N/V/D, no fevers, no back pain, no rash, no cough/SOB, no palpitations, no black or blood in stools.  The symptoms have been associated with no other complaints. The patient has a significant history of similar symptoms previously, recently being evaluated for this complaint and multiple prior evals for same. Pt has been extensively evaluated at multiple ED's for same, as well as his PMD and Cards MD. Cardiologist has ruled out cardiac source for pain and pt has been dx with anxiety and GERD.  Pt has not f/u with GI MD "because my doctor says I smoke and can't go to a GI doctor if I smoke."       Past Medical History  Diagnosis Date  . GERD (gastroesophageal reflux disease)   . Anxiety   . Depression   . Paresthesias   . Chronic abdominal pain   . Chronic chest pain   . Headache   . Normal cardiac stress test 06/2013  . Hx of echocardiogram 07/2013    normal   History reviewed. No pertinent past surgical history.  Family History  Problem Relation Age of Onset  . Bladder Cancer Father   . Hypertension Mother    History  Substance Use Topics  . Smoking status: Current Every Day Smoker -- 0.50 packs/day for 5 years    Types: Cigarettes  . Smokeless tobacco: Never Used  . Alcohol Use: No    Review of Systems ROS: Statement:  All systems negative except as marked or noted in the HPI; Constitutional: Negative for fever and chills. ; ; Eyes: Negative for eye pain, redness and discharge. ; ; ENMT: Negative for ear pain, hoarseness, nasal congestion, sinus pressure and sore throat. ; ; Cardiovascular: +CP, SOB. Negative for palpitations, diaphoresis and peripheral edema. ; ; Respiratory: Negative for cough, wheezing and stridor. ; ; Gastrointestinal: +abd pain. Negative for nausea, vomiting, diarrhea, blood in stool, hematemesis, jaundice and rectal bleeding. . ; ; Genitourinary: Negative for dysuria, flank pain and hematuria. ; ; Musculoskeletal: Negative for back pain and neck pain. Negative for swelling and trauma.; ; Skin: Negative for pruritus, rash, abrasions, blisters, bruising and skin lesion.; ; Neuro: +paresthesias. Negative for headache, lightheadedness and neck stiffness. Negative for weakness, altered level of consciousness , altered mental status, extremity weakness, involuntary movement, seizure and syncope.      Allergies  Review of patient's allergies indicates no known allergies.  Home Medications   Current Outpatient Rx  Name  Route  Sig  Dispense  Refill  . omeprazole (PRILOSEC) 40 MG capsule   Oral   Take 40 mg by mouth daily.          BP 145/82  Pulse 96  Resp 20  Ht 6\' 1"  (1.854 m)  Wt 252  lb (114.306 kg)  BMI 33.25 kg/m2  SpO2 97% Physical Exam 1825: Physical examination:  Nursing notes reviewed; Vital signs and O2 SAT reviewed;  Constitutional: Well developed, Well nourished, Well hydrated, In no acute distress; Head:  Normocephalic, atraumatic; Eyes: EOMI, PERRL, No scleral icterus; ENMT: Mouth and pharynx normal, Mucous membranes moist; Neck: Supple, Full range of motion, No lymphadenopathy; Cardiovascular: Regular rate and rhythm, No murmur, rub, or gallop; Respiratory: Breath sounds clear & equal bilaterally, No rales, rhonchi, wheezes.  Speaking full sentences with ease, Normal  respiratory effort/excursion; Chest: +anterior chest wall tender to palp. No rash, no deformity, no soft tissue crepitus. Movement normal; Abdomen: Soft, Nontender, Nondistended, Normal bowel sounds; Genitourinary: No CVA tenderness; Extremities: Pulses normal, No tenderness, No edema, No calf edema or asymmetry.; Neuro: AA&Ox3, Major CN grossly intact.  Speech clear. No gross focal motor or sensory deficits in extremities. Climbs on and off stretcher easily by himself. Gait steady.; Skin: Color normal, Warm, Dry.   ED Course  Procedures    EKG Interpretation    Date/Time:  Friday November 23 2013 18:46:36 EST Ventricular Rate:  82 PR Interval:  138 QRS Duration: 94 QT Interval:  350 QTC Calculation: 408 R Axis:   90 Text Interpretation:  Normal sinus rhythm Rightward axis Nonspecific ST and T wave abnormality Inferior leads Abnormal ECG When compared with ECG of  05/28/13 and 06/18/13, No significant change was found Confirmed by [facility]  MD, Nicholos Johns [phone]) on 11/23/2013 6:56:21 PM            MDM  MDM Reviewed: previous chart, nursing note and vitals Reviewed previous: labs, ECG and ultrasound Interpretation: labs, ECG and x-ray     Results for orders placed during the hospital encounter of 11/23/13  CBC WITH DIFFERENTIAL      Result Value Range   WBC 7.6  4.0 - 10.5 K/uL   RBC 4.99  4.22 - 5.81 MIL/uL   Hemoglobin 15.1  13.0 - 17.0 g/dL   HCT 96.0  45.4 - 09.8 %   MCV 89.4  78.0 - 100.0 fL   MCH 30.3  26.0 - 34.0 pg   MCHC 33.9  30.0 - 36.0 g/dL   RDW 11.9  14.7 - 82.9 %   Platelets 264  150 - 400 K/uL   Neutrophils Relative % 54  43 - 77 %   Neutro Abs 4.1  1.7 - 7.7 K/uL   Lymphocytes Relative 38  12 - 46 %   Lymphs Abs 2.9  0.7 - 4.0 K/uL   Monocytes Relative 5  3 - 12 %   Monocytes Absolute 0.4  0.1 - 1.0 K/uL   Eosinophils Relative 2  0 - 5 %   Eosinophils Absolute 0.2  0.0 - 0.7 K/uL   Basophils Relative 1  0 - 1 %   Basophils Absolute 0.0  0.0 - 0.1 K/uL   COMPREHENSIVE METABOLIC PANEL      Result Value Range   Sodium 142  137 - 147 mEq/L   Potassium 3.7  3.7 - 5.3 mEq/L   Chloride 104  96 - 112 mEq/L   CO2 25  19 - 32 mEq/L   Glucose, Bld 121 (*) 70 - 99 mg/dL   BUN 18  6 - 23 mg/dL   Creatinine, Ser 5.62  0.50 - 1.35 mg/dL   Calcium 8.9  8.4 - 13.0 mg/dL   Total Protein 7.1  6.0 - 8.3 g/dL   Albumin 4.0  3.5 - 5.2  g/dL   AST 17  0 - 37 U/L   ALT 25  0 - 53 U/L   Alkaline Phosphatase 60  39 - 117 U/L   Total Bilirubin 0.2 (*) 0.3 - 1.2 mg/dL   GFR calc non Af Amer >90  >90 mL/min   GFR calc Af Amer >90  >90 mL/min  LIPASE, BLOOD      Result Value Range   Lipase 39  11 - 59 U/L  TROPONIN I      Result Value Range   Troponin I <0.30  <0.30 ng/mL   Dg Abd Acute W/chest 11/23/2013   CLINICAL DATA:  Chest pain and abdominal pain.  EXAM: ACUTE ABDOMEN SERIES (ABDOMEN 2 VIEW & CHEST 1 VIEW)  COMPARISON:  CT ABD-PELV W/ CM dated 08/21/2013; DG CHEST 2 VIEW dated 07/21/2013; DG CHEST 2V dated 07/02/2013; DG CHEST 2 VIEW dated 05/28/2013; DG CHEST 2V dated 05/23/2013  FINDINGS: Bowel gas pattern unremarkable without evidence of obstruction or significant ileus. No evidence of free air or significant air-fluid levels on the erect image. Expected stool burden throughout normal caliber colon. Phlebolith low in the right side of the pelvis. No visible opaque urinary tract calculi. Regional skeleton intact.  Cardiomediastinal silhouette unremarkable and unchanged. Lungs clear. Bronchovascular markings normal. Pulmonary vascularity normal. No visible pleural effusions. No pneumothorax.  IMPRESSION: No acute abdominal or pulmonary abnormality.   Electronically Signed   By: Hulan Saas M.D.   On: 11/23/2013 19:18    1951:  Doubt PE as cause for symptoms with low risk [place].  Doubt ACS as cause for symptoms with normal troponin and unchanged EKG from previous after 1 year of constant symptoms, as well as negative extensive outpatient Cards evaluation (EPIC  chart reviewed). Workup today reassuring. Pt feels better after meds and wants to go home now. Will continue to tx symptomatically; pt encouraged to f/u with GI MD and mental health provider. Dx and testing d/w pt.  Questions answered.  Verb understanding, agreeable to d/c home with outpt f/u.   Laray Anger, DO 11/26/13 1906

## 2013-11-23 NOTE — ED Notes (Signed)
Pt states his PCP will not see him because he smokes but he still writes him prescriptions. States he tried to get in to see another doctor and was told he had to many medical problems. Pt states he is having burning in his upper abdomen and it goes up his right side. States that is when his fingers get numb

## 2013-11-23 NOTE — ED Notes (Signed)
Patient with no complaints at this time. Respirations even and unlabored. Skin warm/dry. Discharge instructions reviewed with patient at this time. Patient given opportunity to voice concerns/ask questions. Patient discharged at this time and left Emergency Department with steady gait.   

## 2013-11-23 NOTE — Discharge Instructions (Signed)
°Emergency Department Resource Guide °1) Find a Doctor and Pay Out of Pocket °Although you won't have to find out who is covered by your insurance plan, it is a good idea to ask around and get recommendations. You will then need to call the office and see if the doctor you have chosen will accept you as a new patient and what types of options they offer for patients who are self-pay. Some doctors offer discounts or will set up payment plans for their patients who do not have insurance, but you will need to ask so you aren't surprised when you get to your appointment. ° °2) Contact Your Local Health Department °Not all health departments have doctors that can see patients for sick visits, but many do, so it is worth a call to see if yours does. If you don't know where your local health department is, you can check in your phone book. The CDC also has a tool to help you locate your state's health department, and many state websites also have listings of all of their local health departments. ° °3) Find a Walk-in Clinic °If your illness is not likely to be very severe or complicated, you may want to try a walk in clinic. These are popping up all over the country in pharmacies, drugstores, and shopping centers. They're usually staffed by nurse practitioners or physician assistants that have been trained to treat common illnesses and complaints. They're usually fairly quick and inexpensive. However, if you have serious medical issues or chronic medical problems, these are probably not your best option. ° °No Primary Care Doctor: °- Call Health Connect at  832-8000 - they can help you locate a primary care doctor that  accepts your insurance, provides certain services, etc. °- Physician Referral Service- 1-800-533-3463 ° °Chronic Pain Problems: °Organization         Address  Phone   Notes  °[facility]  ([phone]) [phone] Patients need to be referred by their primary care doctor.  ° °Medication  Assistance: °Organization         Address  Phone   Notes  °[place] [facility] [address] °[place], [place] [address] (336) 641-8030 --Must be a resident of [place] °-- Must have NO insurance coverage whatsoever (no Medicaid/ Medicare, etc.) °-- The pt. MUST have a primary care doctor that directs their care regularly and follows them in the community °  °[facility]  (866) 331-1348   °[employer]  (888) 892-1162   ° °Agencies that provide inexpensive medical care: °Organization         Address  Phone   Notes  °[facility]  (336) 832-8035   °[facility]    (336) 832-7272   °[facility] [address] °[place], [place] [address] (336) 832-4777   °[facility] [address], °[place] (336) 271-4999   °[facility]    (336) 373-0678   °[facility]    (336) 272-1050   °[facility] ° [address], [place] Phone:  (336) 832-4444, Fax:  (336) 832-4440 Hours of Operation:  9 am - 6 pm, M-F.  Also accepts Medicaid/Medicare and self-pay.  °[facility] ° [address], [place] Phone: (336) [phone], Fax: (336) [phone]. Hours of Operation:  8:30 am - 5:30 pm, M-F.  Also accepts Medicaid and self-pay.  °[facility] [place] [address], [place] Phone: (336) 878-6027   °[facility] [address], [place], [place] (336)723-1848, Ext. 123 Mondays & Thursdays: 7-9 AM.  First 15 patients are seen on a first come, first serve basis. °  ° °Medicaid-accepting [place] Providers: ° °Organization         Address  Phone   Notes  °[facility] [address], [place] (336) 641-2100 Also accepts self-pay patients.  °[facility] [address], [place] ° (336) 856-9996   °[facility] [address], [place]  (336) 288-8857   °[facility] [address] [place] [address], [place] (336) 299-7000   °Veita Bland [address], [place]  ° (336) 373-1557 Only accepts [place] Access Medicaid patients after they have their name applied to their card.  ° °Self-Pay (no insurance) in [place]: ° °Organization         Address  Phone   Notes  °Sickle Cell Patients, [facility] [address], [place] (336) 832-1970   °[facility] [address], [place] (336) 832-4400   °[facility] ° [address], [place] (336) 992-4800   °[facility]/Dr. Osei-Bonsu ° [address] [place] [address], [place] or [address], [place] (336) [phone] Phone number for both [place] and [place] locations is the same.  °[facility] [address], [place] (336) 299-0000   °[facility] [address] [place] [address], [place] or [address] (336) 852-7530 °(336) 878-2260   °[facility] [address], [place] (336) 350-1642, phone; (336) 294-5005, fax Sees patients 1st and 3rd Saturday of every month.  Must not qualify for public or private insurance (i.e. Medicaid, Medicare, [place] Health Choice, Veterans' Benefits) • Household income should be no more than 200% of the poverty level •The clinic cannot treat you if you are pregnant or think you are pregnant • Sexually transmitted diseases are not treated at the clinic.  ° ° °Dental Care: °Organization         Address  Phone  Notes  °[place] [facility] [address], [place] (336) 641-6152 Accepts children up to age 21 who are enrolled in Medicaid or [place] Health Choice; pregnant women with a Medicaid card; and children who have applied for Medicaid or [place] Health Choice, but were declined, whose parents can pay a reduced fee at time of service.  °[place]  [facility] [place]  [address], [place] (336) 641-7733 Accepts children up to age 21 who are enrolled in Medicaid or [place] Health Choice; pregnant women with a Medicaid card; and children who have applied for Medicaid or [place] Health Choice, but were declined, whose parents can pay a reduced fee at time of service.  °[facility] ° [address], [place] (336) 641-4533 Patients are seen by appointment only. Walk-ins are not accepted. [facility] will see patients 18 years of age and older. °Monday - Tuesday (8am-5pm) °Most Wednesdays (8:30-5pm) °$30 per visit, cash only  °[facility] ° [address], [place] (336) 641-4533 Patients are seen by appointment only. Walk-ins are not accepted. [facility] will see patients 18 years of age and older. °One   Wednesday Evening (Monthly: Volunteer Based).  $30 per visit, cash only  °[facility]  (919) 537-3737 for adults; Children under age 4, call Graduate Pediatric Dentistry at (919) [phone]. Children aged 4-14, please call (919) [phone] to request a pediatric application. ° Dental services are provided in all areas of dental care including fillings, crowns and bridges, complete and partial dentures, implants, gum treatment, root canals, and extractions. Preventive care is also provided. Treatment is provided to both adults and children. °Patients are selected via a lottery and there is often a waiting list. °  °[facility] [address], °[place] ° (336) 763-8833 [email or web address] °  °[facility] [address], [place], [place] (336)723-1848, Ext. 123 Second and Fourth Thursday of each month, opens at 6:30 AM; Clinic ends at 9 AM.  Patients are seen on a first-come first-served basis, and a limited number are seen during each clinic.  ° °[facility] ° [address], [place], [place] (336) 723-7904    Eligibility Requirements °You must have lived in [place], [place], or [place] counties for at least the last three months. °  You cannot be eligible for state or federal sponsored healthcare insurance, including Veterans Administration, Medicaid, or Medicare. °  You generally cannot be eligible for healthcare insurance through your employer.  °  How to apply: °Eligibility screenings are held every Tuesday and Wednesday afternoon from 1:00 pm until 4:00 pm. You do not need an appointment for the interview!  °[facility] [address], [place], [place] 336-631-2330   °[facility]  336-342-8273   °[facility]  336-703-3100   °[facility]  336-570-6415   ° °Behavioral Health Resources in the Community: °Intensive Outpatient Programs °Organization         Address  Phone  Notes  °[facility] [address], [place], [place] 336-878-6098   °[facility] [address], [place], [place] 336-832-9800   °[facility] [address], [place], [place] ° 336-882-2125   °[facility] [address],  °[place], [place] 1-800-853-5163 or 336-641-4981   °Substance Abuse Resources °Organization         Address  Phone  Notes  °[facility]  336-882-2125   °[facility]  336-784-9470   °[facility]  336-285-9073   °[facility]  336-845-3988   °Residential & Outpatient Substance Abuse Program  1-800-659-3381   °Psychological Services °Organization         Address  Phone  Notes  °[facility]  336- 832-9600   °[facility]  336- 378-7881   °[facility] [address], [place] 1-800-853-5163 or 336-641-4981   ° °Mobile Crisis Teams °Organization         Address  Phone  Notes  °[facility], Mobile Crisis Care Unit  1-877-626-1772   °Assertive °[facility] ° [address]  [place], [place] 336-834-9664   °Sharon DeEsch [address] °[place] 336-554-5454   ° °Self-Help/Support Groups °Organization         Address  Phone             Notes  °[facility]  - variety of support groups  336- 373-1402 Call for more information  °Narcotics Anonymous (NA), [facility] [address], °[place]  2 meetings at this location  ° °  Residential Treatment Programs Organization         Address  Phone  Notes  [facility] [address],    [place]  [phone]   [facility]  [address], [place] [phone], [place], [place] [phone] [phone]   [facility] [address], [place] [phone] Admissions: 8am-3pm M-F  [facility] [address]    [place], [place] [phone]   [facility] [address], [place], [place] [phone]   [facility] [address]  [place], [place] [phone]   [facility] Intensive Outpatient [address], [place], [place] [phone]) [phone]   [facility] ([facility] [address]  [place], [place] [phone]-(317)329-4413 or 519-631-6837(769)[phone]   [facility] [address] [place], [place] [phone](904) [phone] Accepts Medicaid  [facility] [address]  [place] [phone] Substance Abuse/Addiction Treatment   [facility]         Address  Phone  Notes  [facility]  9496360997(888) 206-589-1455   Angie FavaJulie Brannon, PhD [address], [address] [place], [place]   [phone](336) [phone] or [phone](336) ([phone])[phone]   [facility]   [address] [place], [place] [phone](336) [phone]   [facility] [address], [place], [place] [phone]) [phone] Insurance/Medicaid/sponsorship through [facility]  [facility] [address]                                    [place], [place] [phone]) [phone] Therapy/tele-psych/case    [facility] [address]   [place], [place] [phone](336) [phone]    Dr. Lolly MustacheArfeen  [phone]) [phone]   [facility] [facility]  [employer] [facility] 1) [address], [place] 2) [address], [place] 3)  [address], [place] [phone](336) [phone](336) [phone]  [phone](336) [phone]   [facility] Child Abuse Hotline (231)240-5141(336) 332-867-5529 or (830)021-7665(336) 8701168641 (After Hours)       Eat a bland diet, avoiding greasy, fatty, fried foods, as well as spicy and acidic foods or beverages.  Avoid eating within the hour or 2 before going to bed or laying down.  Also avoid teas, colas, coffee, chocolate, pepermint and spearment.  Take over the counter pepcid, one tablet by mouth twice a day, for the next 2 to 3 weeks.  May also take over the counter maalox/mylanta, as directed on packaging, as needed for discomfort.  Take your usual prescriptions as previously directed.  Call your regular medical doctor and the GI doctor on Monday to schedule a follow up appointment next week.  Return to the Emergency Department immediately if worsening.

## 2013-11-23 NOTE — ED Notes (Signed)
Patient c/o mid-sternal chest pain x1 year. Per patient has had right abd pain that radiates up right side intermittently since December. Denies any nausea or vomiting. Patient reports when pain in stomach pain comes he gets short of breath and has "gas." Patient reports numbness in hands/fingers bilaterally x2 days.

## 2013-11-29 ENCOUNTER — Encounter (INDEPENDENT_AMBULATORY_CARE_PROVIDER_SITE_OTHER): Payer: Self-pay | Admitting: *Deleted

## 2013-12-04 ENCOUNTER — Encounter (INDEPENDENT_AMBULATORY_CARE_PROVIDER_SITE_OTHER): Payer: Self-pay | Admitting: Internal Medicine

## 2013-12-04 ENCOUNTER — Encounter (HOSPITAL_COMMUNITY): Payer: Self-pay

## 2013-12-04 ENCOUNTER — Other Ambulatory Visit (INDEPENDENT_AMBULATORY_CARE_PROVIDER_SITE_OTHER): Payer: Self-pay | Admitting: *Deleted

## 2013-12-04 ENCOUNTER — Ambulatory Visit (INDEPENDENT_AMBULATORY_CARE_PROVIDER_SITE_OTHER): Payer: Medicaid Other | Admitting: Internal Medicine

## 2013-12-04 ENCOUNTER — Encounter (INDEPENDENT_AMBULATORY_CARE_PROVIDER_SITE_OTHER): Payer: Self-pay | Admitting: *Deleted

## 2013-12-04 VITALS — BP 142/82 | HR 84 | Temp 97.8°F | Ht 73.0 in | Wt 251.0 lb

## 2013-12-04 DIAGNOSIS — R109 Unspecified abdominal pain: Secondary | ICD-10-CM | POA: Insufficient documentation

## 2013-12-04 DIAGNOSIS — G8929 Other chronic pain: Secondary | ICD-10-CM

## 2013-12-04 DIAGNOSIS — R1013 Epigastric pain: Principal | ICD-10-CM

## 2013-12-04 NOTE — Patient Instructions (Signed)
EGD with Dr. Rehman. 

## 2013-12-04 NOTE — Progress Notes (Addendum)
Subjective:     Patient ID: Marco Martinez, male   DOB: [birth year], 33 y.o.   MRN: [id number]  HPI Referred to our office from the ED dept at [facility] in January. Recently seen at [facility]. He c/o constant upper abdominal pain for about a year. He also had chest pain and a gassy feeling.  Abdominal pain described as burning.  He apparently has been evaluated in other ED's for the same complaints. The pain is located in his chest,epigastric region and rt upper quadrant. The pain will usually last a week at a time. The pain comes and goes. The pain is not related to eating.  The pain actually will start in his chest.  He has acid reflux and states he could take a Rolaid and the pain would resolved when his symptoms first started. He has had symptoms 8-9 months.   He has been evaluated by Cardiology and cardiac source of pain has been ruled out.  Appetite is good. No weight loss. No abdominal pain. He does c/o chest pain this morning. BMs are okay. No melena or bright red rectal bleeding. No dysphagia.  He tells me he has been off [employer] for 4 months. He was taking 4 a day for about 9 months for pain in his rt leg.   07/26/2013 Echo Dr.McDowell  Notes Recorded by Jonelle SidleSamuel G McDowell, MD on 07/27/2013 at 8:07 AM Reviewed report. Normal LVEF, normal aortic root size, no pericardial effusion. This is a reassuring study. No further cardiac workup planned at this time.    11/26/2013 EKG: normal.  07/03/2013 [facility]: Troponin's x 3 were normal.  06/14/2013 Negative stress test.    CBC    Component Value Date/Time   WBC 7.6 11/23/2013 1850   RBC 4.99 11/23/2013 1850   HGB 15.1 11/23/2013 1850   HCT 44.6 11/23/2013 1850   PLT 264 11/23/2013 1850   MCV 89.4 11/23/2013 1850   MCH 30.3 11/23/2013 1850   MCHC 33.9 11/23/2013 1850   RDW 12.9 11/23/2013 1850   LYMPHSABS 2.9 11/23/2013 1850   MONOABS 0.4 11/23/2013 1850   EOSABS 0.2 11/23/2013 1850   BASOSABS 0.0 11/23/2013 1850    CMP     Component Value Date/Time   NA  142 11/23/2013 1850   K 3.7 11/23/2013 1850   CL 104 11/23/2013 1850   CO2 25 11/23/2013 1850   GLUCOSE 121* 11/23/2013 1850   BUN 18 11/23/2013 1850   CREATININE 0.77 11/23/2013 1850   CALCIUM 8.9 11/23/2013 1850   PROT 7.1 11/23/2013 1850   ALBUMIN 4.0 11/23/2013 1850   AST 17 11/23/2013 1850   ALT 25 11/23/2013 1850   ALKPHOS 60 11/23/2013 1850   BILITOT 0.2* 11/23/2013 1850   GFRNONAA >90 11/23/2013 1850   GFRAA >90 11/23/2013 1850    11/23/2013 Abdominal film: Chest and abdominal pain;   No acute abdominal or pulmonary abnormality.    Review of Systems see hpi  Married. Works with [employer]. One child. Married.    Current Outpatient Prescriptions  Medication Sig Dispense Refill  . acetaminophen (TYLENOL) 500 MG tablet Take 500 mg by mouth every 6 (six) hours as needed.      . Aspirin-Acetaminophen (GOODY BODY PAIN) 500-325 MG PACK Take by mouth.      . Ca Carbonate-Mag Hydroxide (ROLAIDS PO) Take by mouth.      . famotidine (PEPCID) 10 MG tablet Take 10 mg by mouth 2 (two) times daily.      .Marland Kitchen  omeprazole (PRILOSEC) 40 MG capsule Take 40 mg by mouth daily.       No current facility-administered medications for this visit.   Past Medical History  Diagnosis Date  . GERD (gastroesophageal reflux disease)   . Anxiety   . Depression   . Paresthesias   . Chronic abdominal pain   . Chronic chest pain   . Headache   . Normal cardiac stress test 06/2013  . Hx of echocardiogram 07/2013    normal        Objective:   Physical Exam  Filed Vitals:   12/04/13 1014  BP: 142/82  Pulse: 84  Temp: 97.8 F (36.6 C)  Height: 6\' 1"  (1.854 m)  Weight: 251 lb (113.853 kg)   Alert and oriented. Skin warm and dry. Oral mucosa is moist.   . Sclera anicteric, conjunctivae is pink. Thyroid not enlarged. No cervical lymphadenopathy. Lungs clear. Heart regular rate and rhythm.  Abdomen is soft. Bowel sounds are positive. No hepatomegaly. No abdominal masses felt. No tenderness.  No  edema to lower extremities.       Assessment:    Chest and epigastric pain. Cardiac work up negative. PUD needs to be ruled out. If EGD normal: GB disease needs to be ruled out.    Plan:     EGD with Dr. Karilyn Cota. The risks and benefits such as perforation, bleeding, and infection were reviewed with the patient and is agreeable.

## 2013-12-06 ENCOUNTER — Ambulatory Visit (HOSPITAL_COMMUNITY)
Admission: RE | Admit: 2013-12-06 | Discharge: 2013-12-06 | Disposition: A | Discharge status: Home / Self Care | Payer: Medicaid Other | Source: Ambulatory Visit | Attending: Internal Medicine | Admitting: Internal Medicine

## 2013-12-06 ENCOUNTER — Encounter (HOSPITAL_COMMUNITY)
Admission: RE | Disposition: A | Discharge status: Home / Self Care | Payer: Self-pay | Source: Ambulatory Visit | Attending: Internal Medicine

## 2013-12-06 ENCOUNTER — Encounter (HOSPITAL_COMMUNITY): Payer: Self-pay | Admitting: *Deleted

## 2013-12-06 DIAGNOSIS — K2970 Gastritis, unspecified, without bleeding: Secondary | ICD-10-CM | POA: Insufficient documentation

## 2013-12-06 DIAGNOSIS — R1013 Epigastric pain: Secondary | ICD-10-CM

## 2013-12-06 DIAGNOSIS — R1011 Right upper quadrant pain: Secondary | ICD-10-CM | POA: Insufficient documentation

## 2013-12-06 DIAGNOSIS — K449 Diaphragmatic hernia without obstruction or gangrene: Secondary | ICD-10-CM | POA: Insufficient documentation

## 2013-12-06 DIAGNOSIS — K2990 Gastroduodenitis, unspecified, without bleeding: Secondary | ICD-10-CM

## 2013-12-06 DIAGNOSIS — G8929 Other chronic pain: Secondary | ICD-10-CM

## 2013-12-06 HISTORY — PX: ESOPHAGOGASTRODUODENOSCOPY: SHX5428

## 2013-12-06 SURGERY — EGD (ESOPHAGOGASTRODUODENOSCOPY)
Anesthesia: Moderate Sedation

## 2013-12-06 MED ORDER — BUTAMBEN-TETRACAINE-BENZOCAINE 2-2-14 % EX AERO
INHALATION_SPRAY | CUTANEOUS | Status: DC | PRN
  Administered 2013-12-06: 1 via TOPICAL

## 2013-12-06 MED ORDER — SODIUM CHLORIDE 0.9 % IV SOLN
INTRAVENOUS | Status: DC
  Administered 2013-12-06: 12:00:00 via INTRAVENOUS

## 2013-12-06 MED ORDER — MIDAZOLAM HCL 5 MG/5ML IJ SOLN
INTRAMUSCULAR | Status: AC
  Filled 2013-12-06: qty 10

## 2013-12-06 MED ORDER — MEPERIDINE HCL 50 MG/ML IJ SOLN
INTRAMUSCULAR | Status: AC
  Filled 2013-12-06: qty 1

## 2013-12-06 MED ORDER — STERILE WATER FOR IRRIGATION IR SOLN
Status: DC | PRN
  Administered 2013-12-06: 13:00:00

## 2013-12-06 MED ORDER — MIDAZOLAM HCL 5 MG/5ML IJ SOLN
INTRAMUSCULAR | Status: DC | PRN
  Administered 2013-12-06: 2 mg via INTRAVENOUS
  Administered 2013-12-06: 1 mg via INTRAVENOUS
  Administered 2013-12-06 (×2): 2 mg via INTRAVENOUS
  Administered 2013-12-06: 3 mg via INTRAVENOUS

## 2013-12-06 MED ORDER — MEPERIDINE HCL 50 MG/ML IJ SOLN
INTRAMUSCULAR | Status: DC | PRN
  Administered 2013-12-06 (×2): 25 mg via INTRAVENOUS

## 2013-12-06 NOTE — H&P (Addendum)
Bobbie StackJohnny W Fuertes is an 33 y.o. male.   Chief Complaint: Patient is here for EGD. HPI: This 33 year old Caucasian male who presents with seven month history of chest pain and several month history of epigastric/right upper quadrant abdominal pain. He underwent noninvasive cardiac evaluation which was negative. Has not responded PPI therapy. [facility] for 7 months late he's been sporadic. He denies nausea vomiting melena or rectal bleeding. Pain can last for hours. He does not believe the pain gets better or worse with meals. He smokes cigarettes and does not drink alcohol. CBC and comprehensive chemistry panel was normal  Past Medical History  Diagnosis Date  . GERD (gastroesophageal reflux disease)   . Anxiety   . Depression   . Paresthesias   . Chronic abdominal pain   . Chronic chest pain   . Headache   . Normal cardiac stress test 06/2013  . Hx of echocardiogram 07/2013    normal    History reviewed. No pertinent past surgical history.  Family History  Problem Relation Age of Onset  . Bladder Cancer Father   . Hypertension Mother    Social History:  reports that he has been smoking Cigarettes.  He has a 30 pack-year smoking history. He has never used smokeless tobacco. He reports that he does not drink alcohol or use illicit drugs.  Allergies: No Known Allergies  Medications Prior to Admission  Medication Sig Dispense Refill  . acetaminophen (TYLENOL) 500 MG tablet Take 500-1,000 mg by mouth 4 (four) times daily as needed for mild pain.       . Ca Carbonate-Mag Hydroxide (ROLAIDS PO) Take 1 tablet by mouth daily as needed (heartburn/indigestion).       . famotidine (PEPCID) 10 MG tablet Take 10 mg by mouth 2 (two) times daily as needed for heartburn or indigestion.       Marland Kitchen. omeprazole (PRILOSEC) 40 MG capsule Take 40 mg by mouth daily.        No results found for this or any previous visit (from the past 48 hour(s)). No results found.  ROS  Blood  pressure 127/81, pulse 76, temperature 98.3 F (36.8 C), temperature source Oral, resp. rate 19, height 6\' 1"  (1.854 m), weight 251 lb (113.853 kg), SpO2 99.00%. Physical Exam  Constitutional: He appears well-developed and well-nourished.  HENT:  Mouth/Throat: Oropharynx is clear and moist.  Eyes: Conjunctivae are normal. No scleral icterus.  Neck: No thyromegaly present.  Cardiovascular: Normal rate, regular rhythm and normal heart sounds.   No murmur heard. Respiratory: Effort normal and breath sounds normal.  GI: Soft. He exhibits no distension and no mass. There is no tenderness.  Musculoskeletal: He exhibits no edema.  Lymphadenopathy:    He has no cervical adenopathy.  Neurological: He is alert.  Skin: Skin is warm and dry.     Assessment/Plan Noncardiac chest pain. Epigastric and right upper quadrant abdominal pain. Diagnostic EGD.  Fransisco Messmer U 12/06/2013, 12:51 PM

## 2013-12-06 NOTE — Discharge Instructions (Signed)
Resume usual diet and medications. Ultrasound of upper abdomen to be scheduled. Physician will contact you with results of blood test and ultrasound when completed No driving for 24 hours  Esophagogastroduodenoscopy Care After Refer to this sheet in the next few weeks. These instructions provide you with information on caring for yourself after your procedure. Your caregiver may also give you more specific instructions. Your treatment has been planned according to current medical practices, but problems sometimes occur. Call your caregiver if you have any problems or questions after your procedure.  HOME CARE INSTRUCTIONS  Do not eat or drink anything until the numbing medicine (local anesthetic) has worn off and your gag reflex has returned. You will know that the local anesthetic has worn off when you can swallow comfortably.  Do not drive for 12 hours after the procedure or as directed by your caregiver.  Only take medicines as directed by your caregiver. SEEK MEDICAL CARE IF:   You cannot stop coughing.  You are not urinating at all or less than usual. SEEK IMMEDIATE MEDICAL CARE IF:  You have difficulty swallowing.  You cannot eat or drink.  You have worsening throat or chest pain.  You have dizziness, lightheadedness, or you faint.  You have nausea or vomiting.  You have chills.  You have a fever.  You have severe abdominal pain.  You have black, tarry, or bloody stools.

## 2013-12-06 NOTE — Op Note (Addendum)
EGD PROCEDURE REPORT  PATIENT:  Marco StackJohnny W Martinez  MR#:  [id number] Birthdate:  30-Jun-1981, 33 y.o., male Endoscopist:  Dr. Malissa HippoNajeeb U. Rehman, MD Referred By:  Dr. Laray AngerKathleen M. McManus, DO Procedure Date: 12/06/2013  Procedure:   EGD  Indications:  Patient is 33 year old Caucasian male with almost a year history of chest pain which negative cardiac evaluation and also experiences epigastric/right upper quadrant pain. [facility]-4 times a day until a few weeks ago. He has not responded to PPI therapy. He is undergoing diagnostic EGD.            Informed Consent:  The risks, benefits, alternatives & imponderables which include, but are not limited to, bleeding, infection, perforation, drug reaction and potential missed lesion have been reviewed.  The potential for biopsy, lesion removal, esophageal dilation, etc. have also been discussed.  Questions have been answered.  All parties agreeable.  Please see history & physical in medical record for more information.  Medications:  Demerol 50 mg IV Versed 10 mg IV Cetacaine spray topically for oropharyngeal anesthesia  Description of procedure:  The endoscope was introduced through the mouth and advanced to the second portion of the duodenum without difficulty or limitations. The mucosal surfaces were surveyed very carefully during advancement of the scope and upon withdrawal.  Findings:  Esophagus:  Mucosa of the esophagus was normal. GE junction was unremarkable. GEJ:  35 cm Hiatus:  37 cm Stomach:  Stomach was empty and distended very well with insufflation. Folds in the proximal stomach were normal. Examination of mucosa at body was normal. There was erythema and edema to mucosa in the prepyloric region and pyloric channel. Pyloric channel however was patent. Angularis fundus and cardia was examined by retroflexing the scope and were normal. Duodenum:  Normal bulbar and post bulbar mucosa.  Therapeutic/Diagnostic Maneuvers  Performed:  None  Complications:  None  Impression: Small sliding hiatal hernia without evidence of erosive esophagitis. Prepyloric gastritis.  Comment; These findings will not explain patient's symptoms.  Recommendations:  H. pylori serology. Upper abdominal ultrasound.  REHMAN,NAJEEB U  12/06/2013  1:08 PM  CC: Dr. Annette StableXaje A. Olena LeatherwoodHasanaj, MD

## 2013-12-07 LAB — H. PYLORI ANTIBODY, IGG: H Pylori IgG: <0.4 {ISR}

## 2013-12-11 ENCOUNTER — Encounter (HOSPITAL_COMMUNITY): Payer: Self-pay | Admitting: Internal Medicine

## 2013-12-13 ENCOUNTER — Other Ambulatory Visit (INDEPENDENT_AMBULATORY_CARE_PROVIDER_SITE_OTHER): Payer: Self-pay | Admitting: Internal Medicine

## 2013-12-13 DIAGNOSIS — R0789 Other chest pain: Secondary | ICD-10-CM

## 2013-12-13 DIAGNOSIS — G8929 Other chronic pain: Secondary | ICD-10-CM

## 2013-12-13 DIAGNOSIS — R1013 Epigastric pain: Secondary | ICD-10-CM

## 2013-12-13 DIAGNOSIS — R1011 Right upper quadrant pain: Secondary | ICD-10-CM

## 2013-12-20 ENCOUNTER — Ambulatory Visit (HOSPITAL_COMMUNITY)
Admission: RE | Admit: 2013-12-20 | Discharge: 2013-12-20 | Disposition: A | Discharge status: Home / Self Care | Payer: Medicaid Other | Source: Ambulatory Visit | Attending: Internal Medicine | Admitting: Internal Medicine

## 2013-12-20 DIAGNOSIS — R109 Unspecified abdominal pain: Secondary | ICD-10-CM | POA: Insufficient documentation

## 2013-12-20 DIAGNOSIS — R0789 Other chest pain: Secondary | ICD-10-CM

## 2013-12-20 DIAGNOSIS — G8929 Other chronic pain: Secondary | ICD-10-CM

## 2013-12-20 DIAGNOSIS — R1011 Right upper quadrant pain: Secondary | ICD-10-CM

## 2013-12-20 DIAGNOSIS — R1013 Epigastric pain: Secondary | ICD-10-CM

## 2013-12-23 ENCOUNTER — Emergency Department (HOSPITAL_COMMUNITY)
Admission: EM | Admit: 2013-12-23 | Discharge: 2013-12-24 | Disposition: A | Discharge status: Home / Self Care | Payer: Medicaid Other | Attending: Emergency Medicine | Admitting: Emergency Medicine

## 2013-12-23 ENCOUNTER — Encounter (HOSPITAL_COMMUNITY): Payer: Self-pay | Admitting: Emergency Medicine

## 2013-12-23 DIAGNOSIS — Z8739 Personal history of other diseases of the musculoskeletal system and connective tissue: Secondary | ICD-10-CM | POA: Insufficient documentation

## 2013-12-23 DIAGNOSIS — Z87828 Personal history of other (healed) physical injury and trauma: Secondary | ICD-10-CM | POA: Insufficient documentation

## 2013-12-23 DIAGNOSIS — F17200 Nicotine dependence, unspecified, uncomplicated: Secondary | ICD-10-CM | POA: Insufficient documentation

## 2013-12-23 DIAGNOSIS — IMO0001 Reserved for inherently not codable concepts without codable children: Secondary | ICD-10-CM | POA: Insufficient documentation

## 2013-12-23 DIAGNOSIS — Z8659 Personal history of other mental and behavioral disorders: Secondary | ICD-10-CM | POA: Insufficient documentation

## 2013-12-23 DIAGNOSIS — Z79899 Other long term (current) drug therapy: Secondary | ICD-10-CM | POA: Insufficient documentation

## 2013-12-23 DIAGNOSIS — G8929 Other chronic pain: Secondary | ICD-10-CM | POA: Insufficient documentation

## 2013-12-23 DIAGNOSIS — M79609 Pain in unspecified limb: Secondary | ICD-10-CM | POA: Insufficient documentation

## 2013-12-23 DIAGNOSIS — R0789 Other chest pain: Secondary | ICD-10-CM | POA: Insufficient documentation

## 2013-12-23 DIAGNOSIS — R0602 Shortness of breath: Secondary | ICD-10-CM | POA: Insufficient documentation

## 2013-12-23 NOTE — ED Notes (Signed)
Patient c/o bilateral side pain, chest pain, arm pain, "inside" pain.  States has been going on for 10 months.

## 2013-12-24 ENCOUNTER — Other Ambulatory Visit (INDEPENDENT_AMBULATORY_CARE_PROVIDER_SITE_OTHER): Payer: Self-pay | Admitting: Internal Medicine

## 2013-12-24 ENCOUNTER — Emergency Department (HOSPITAL_COMMUNITY): Payer: Medicaid Other

## 2013-12-24 DIAGNOSIS — R1013 Epigastric pain: Principal | ICD-10-CM

## 2013-12-24 DIAGNOSIS — G8929 Other chronic pain: Secondary | ICD-10-CM

## 2013-12-24 DIAGNOSIS — R1110 Vomiting, unspecified: Secondary | ICD-10-CM

## 2013-12-24 DIAGNOSIS — IMO0001 Reserved for inherently not codable concepts without codable children: Secondary | ICD-10-CM

## 2013-12-24 DIAGNOSIS — R1011 Right upper quadrant pain: Secondary | ICD-10-CM

## 2013-12-24 LAB — CBC WITH DIFFERENTIAL/PLATELET
BASOS ABS: 0 10*3/uL (ref 0.0–0.1)
Basophils Relative: 0 % (ref 0–1)
Eosinophils Absolute: 0.2 10*3/uL (ref 0.0–0.7)
Eosinophils Relative: 2 % (ref 0–5)
HCT: 44.1 % (ref 39.0–52.0)
HEMOGLOBIN: 15.1 g/dL (ref 13.0–17.0)
LYMPHS PCT: 45 % (ref 12–46)
Lymphs Abs: 3.9 10*3/uL (ref 0.7–4.0)
MCH: 30.6 pg (ref 26.0–34.0)
MCHC: 34.2 g/dL (ref 30.0–36.0)
MCV: 89.3 fL (ref 78.0–100.0)
MONO ABS: 0.6 10*3/uL (ref 0.1–1.0)
MONOS PCT: 7 % (ref 3–12)
NEUTROS ABS: 4 10*3/uL (ref 1.7–7.7)
NEUTROS PCT: 46 % (ref 43–77)
Platelets: 270 10*3/uL (ref 150–400)
RBC: 4.94 MIL/uL (ref 4.22–5.81)
RDW: 12.8 % (ref 11.5–15.5)
WBC: 8.6 10*3/uL (ref 4.0–10.5)

## 2013-12-24 LAB — BASIC METABOLIC PANEL
BUN: 13 mg/dL (ref 6–23)
CHLORIDE: 101 meq/L (ref 96–112)
CO2: 28 mEq/L (ref 19–32)
Calcium: 9.3 mg/dL (ref 8.4–10.5)
Creatinine, Ser: 0.81 mg/dL (ref 0.50–1.35)
GFR calc non Af Amer: >90 mL/min (ref >90)
Glucose, Bld: 110 mg/dL — ABNORMAL HIGH (ref 70–99)
Potassium: 4 mEq/L (ref 3.7–5.3)
Sodium: 139 mEq/L (ref 137–147)

## 2013-12-24 LAB — TROPONIN I

## 2013-12-24 LAB — D-DIMER, QUANTITATIVE (NOT AT ARMC)

## 2013-12-24 MED ORDER — TRAMADOL HCL 50 MG PO TABS: 50.0000 mg | ORAL_TABLET | Freq: Four times a day (QID) | ORAL | Status: DC | PRN

## 2013-12-24 MED ORDER — TRAMADOL HCL 50 MG PO TABS
50.0000 mg | ORAL_TABLET | Freq: Once | ORAL | Status: AC
  Administered 2013-12-24: 50 mg via ORAL
  Filled 2013-12-24: qty 1

## 2013-12-24 NOTE — Discharge Instructions (Signed)
Your tests today showed no sign of heart problems or lung problems. A blood test ruled out blood clots. Follow up with your doctor for further evaluation as an outpatient.  Chest Pain (Nonspecific) It is often hard to give a specific diagnosis for the cause of chest pain. There is always a chance that your pain could be related to something serious, such as a heart attack or a blood clot in the lungs. You need to follow up with your caregiver for further evaluation. CAUSES   Heartburn.  Pneumonia or bronchitis.  Anxiety or stress.  Inflammation around your heart (pericarditis) or lung (pleuritis or pleurisy).  A blood clot in the lung.  A collapsed lung (pneumothorax). It can develop suddenly on its own (spontaneous pneumothorax) or from injury (trauma) to the chest.  Shingles infection (herpes zoster virus). The chest wall is composed of bones, muscles, and cartilage. Any of these can be the source of the pain.  The bones can be bruised by injury.  The muscles or cartilage can be strained by coughing or overwork.  The cartilage can be affected by inflammation and become sore (costochondritis). DIAGNOSIS  Lab tests or other studies, such as X-rays, electrocardiography, stress testing, or cardiac imaging, may be needed to find the cause of your pain.  TREATMENT   Treatment depends on what may be causing your chest pain. Treatment may include:  Acid blockers for heartburn.  Anti-inflammatory medicine.  Pain medicine for inflammatory conditions.  Antibiotics if an infection is present.  You may be advised to change lifestyle habits. This includes stopping smoking and avoiding alcohol, caffeine, and chocolate.  You may be advised to keep your head raised (elevated) when sleeping. This reduces the chance of acid going backward from your stomach into your esophagus.  Most of the time, nonspecific chest pain will improve within 2 to 3 days with rest and mild pain medicine. HOME  CARE INSTRUCTIONS   If antibiotics were prescribed, take your antibiotics as directed. Finish them even if you start to feel better.  For the next few days, avoid physical activities that bring on chest pain. Continue physical activities as directed.  Do not smoke.  Avoid drinking alcohol.  Only take over-the-counter or prescription medicine for pain, discomfort, or fever as directed by your caregiver.  Follow your caregiver's suggestions for further testing if your chest pain does not go away.  Keep any follow-up appointments you made. If you do not go to an appointment, you could develop lasting (chronic) problems with pain. If there is any problem keeping an appointment, you must call to reschedule. SEEK MEDICAL CARE IF:   You think you are having problems from the medicine you are taking. Read your medicine instructions carefully.  Your chest pain does not go away, even after treatment.  You develop a rash with blisters on your chest. SEEK IMMEDIATE MEDICAL CARE IF:   You have increased chest pain or pain that spreads to your arm, neck, jaw, back, or abdomen.  You develop shortness of breath, an increasing cough, or you are coughing up blood.  You have severe back or abdominal pain, feel nauseous, or vomit.  You develop severe weakness, fainting, or chills.  You have a fever. THIS IS AN EMERGENCY. Do not wait to see if the pain will go away. Get medical help at once. Call your local emergency services (911 in [place] Do not drive yourself to the hospital. MAKE SURE YOU:   Understand these instructions.  Will watch  your condition.  Will get help right away if you are not doing well or get worse. Document Released: 07/21/2005 Document Revised: 01/03/2012 Document Reviewed: 05/16/2008 [facility] Patient Information 2014 [place], [place].  Tramadol tablets What is this medicine? TRAMADOL (TRA ma dole) is a pain reliever. It is used to treat moderate to severe pain in  adults. This medicine may be used for other purposes; ask your health care provider or pharmacist if you have questions. COMMON BRAND NAME(S): Ultram What should I tell my health care provider before I take this medicine? They need to know if you have any of these conditions: -brain tumor -depression -drug abuse or addiction -head injury -if you frequently drink alcohol containing drinks -kidney disease or trouble passing urine -liver disease -lung disease, asthma, or breathing problems -seizures or epilepsy -suicidal thoughts, plans, or attempt; a previous suicide attempt by you or a family member -an unusual or allergic reaction to tramadol, codeine, other medicines, foods, dyes, or preservatives -pregnant or trying to get pregnant -breast-feeding How should I use this medicine? Take this medicine by mouth with a full glass of water. Follow the directions on the prescription label. If the medicine upsets your stomach, take it with food or milk. Do not take more medicine than you are told to take. Talk to your pediatrician regarding the use of this medicine in children. Special care may be needed. Overdosage: If you think you have taken too much of this medicine contact a poison control center or emergency room at once. NOTE: This medicine is only for you. Do not share this medicine with others. What if I miss a dose? If you miss a dose, take it as soon as you can. If it is almost time for your next dose, take only that dose. Do not take double or extra doses. What may interact with this medicine? Do not take this medicine with any of the following medications: -MAOIs like Carbex, Eldepryl, Marplan, Nardil, and Parnate This medicine may also interact with the following medications: -alcohol or medicines that contain alcohol -antihistamines -benzodiazepines -bupropion -carbamazepine or oxcarbazepine -clozapine -cyclobenzaprine -digoxin -furazolidone -linezolid -medicines for  depression, anxiety, or psychotic disturbances -medicines for migraine headache like almotriptan, eletriptan, frovatriptan, naratriptan, rizatriptan, sumatriptan, zolmitriptan -medicines for pain like pentazocine, buprenorphine, butorphanol, meperidine, nalbuphine, and propoxyphene -medicines for sleep -muscle relaxants -naltrexone -phenobarbital -phenothiazines like perphenazine, thioridazine, chlorpromazine, mesoridazine, fluphenazine, prochlorperazine, promazine, and trifluoperazine -procarbazine -warfarin This list may not describe all possible interactions. Give your health care provider a list of all the medicines, herbs, non-prescription drugs, or dietary supplements you use. Also tell them if you smoke, drink alcohol, or use illegal drugs. Some items may interact with your medicine. What should I watch for while using this medicine? Tell your doctor or health care professional if your pain does not go away, if it gets worse, or if you have new or a different type of pain. You may develop tolerance to the medicine. Tolerance means that you will need a higher dose of the medicine for pain relief. Tolerance is normal and is expected if you take this medicine for a long time. Do not suddenly stop taking your medicine because you may develop a severe reaction. Your body becomes used to the medicine. This does NOT mean you are addicted. Addiction is a behavior related to getting and using a drug for a non-medical reason. If you have pain, you have a medical reason to take pain medicine. Your doctor will tell you how much medicine  to take. If your doctor wants you to stop the medicine, the dose will be slowly lowered over time to avoid any side effects. You may get drowsy or dizzy. Do not drive, use machinery, or do anything that needs mental alertness until you know how this medicine affects you. Do not stand or sit up quickly, especially if you are an older patient. This reduces the risk of dizzy or  fainting spells. Alcohol can increase or decrease the effects of this medicine. Avoid alcoholic drinks. You may have constipation. Try to have a bowel movement at least every 2 to 3 days. If you do not have a bowel movement for 3 days, call your doctor or health care professional. Your mouth may get dry. Chewing sugarless gum or sucking hard candy, and drinking plenty of water may help. Contact your doctor if the problem does not go away or is severe. What side effects may I notice from receiving this medicine? Side effects that you should report to your doctor or health care professional as soon as possible: -allergic reactions like skin rash, itching or hives, swelling of the face, lips, or tongue -breathing difficulties, wheezing -confusion -itching -light headedness or fainting spells -redness, blistering, peeling or loosening of the skin, including inside the mouth -seizures Side effects that usually do not require medical attention (report to your doctor or health care professional if they continue or are bothersome): -constipation -dizziness -drowsiness -headache -nausea, vomiting This list may not describe all possible side effects. Call your doctor for medical advice about side effects. You may report side effects to FDA at 1-800-FDA-1088. Where should I keep my medicine? Keep out of the reach of children. Store at room temperature between 15 and 30 degrees C (59 and 86 degrees F). Keep container tightly closed. Throw away any unused medicine after the expiration date. NOTE: This sheet is a summary. It may not cover all possible information. If you have questions about this medicine, talk to your doctor, pharmacist, or health care provider.  2014, [employer] Standard. (2010-06-24 11:55:44)
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
## 2013-12-27 ENCOUNTER — Encounter (HOSPITAL_COMMUNITY)
Admission: RE | Admit: 2013-12-27 | Discharge: 2013-12-27 | Disposition: A | Discharge status: Home / Self Care | Payer: Medicaid Other | Source: Ambulatory Visit | Attending: Internal Medicine | Admitting: Internal Medicine

## 2013-12-27 ENCOUNTER — Encounter (HOSPITAL_COMMUNITY): Payer: Self-pay

## 2013-12-27 DIAGNOSIS — IMO0001 Reserved for inherently not codable concepts without codable children: Secondary | ICD-10-CM

## 2013-12-27 DIAGNOSIS — G8929 Other chronic pain: Secondary | ICD-10-CM

## 2013-12-27 DIAGNOSIS — R109 Unspecified abdominal pain: Secondary | ICD-10-CM | POA: Insufficient documentation

## 2013-12-27 DIAGNOSIS — R1011 Right upper quadrant pain: Secondary | ICD-10-CM

## 2013-12-27 DIAGNOSIS — R1013 Epigastric pain: Secondary | ICD-10-CM

## 2013-12-27 DIAGNOSIS — R1110 Vomiting, unspecified: Secondary | ICD-10-CM

## 2013-12-27 MED ORDER — SINCALIDE 5 MCG IJ SOLR
INTRAMUSCULAR | Status: AC
  Administered 2013-12-27: 2.27 ug
  Filled 2013-12-27: qty 5

## 2013-12-27 MED ORDER — STERILE WATER FOR INJECTION IJ SOLN
INTRAMUSCULAR | Status: AC
  Administered 2013-12-27: 5 mL
  Filled 2013-12-27: qty 10

## 2013-12-27 MED ORDER — TECHNETIUM TC 99M MEBROFENIN IV KIT
5.0000 | PACK | Freq: Once | INTRAVENOUS | Status: AC | PRN
  Administered 2013-12-27: 5 via INTRAVENOUS

## 2014-01-01 NOTE — Progress Notes (Signed)
Apt has been scheduled for 01/22/14 with Dr. Rehman.  

## 2014-01-22 ENCOUNTER — Ambulatory Visit (INDEPENDENT_AMBULATORY_CARE_PROVIDER_SITE_OTHER): Payer: Medicaid Other | Admitting: Internal Medicine

## 2014-02-27 ENCOUNTER — Emergency Department (HOSPITAL_COMMUNITY)
Admission: EM | Admit: 2014-02-27 | Discharge: 2014-02-27 | Disposition: A | Discharge status: Home / Self Care | Payer: Medicaid Other | Attending: Emergency Medicine | Admitting: Emergency Medicine

## 2014-02-27 ENCOUNTER — Encounter (HOSPITAL_COMMUNITY): Payer: Self-pay | Admitting: Emergency Medicine

## 2014-02-27 DIAGNOSIS — M25529 Pain in unspecified elbow: Secondary | ICD-10-CM

## 2014-02-27 DIAGNOSIS — F411 Generalized anxiety disorder: Secondary | ICD-10-CM | POA: Insufficient documentation

## 2014-02-27 DIAGNOSIS — Z79899 Other long term (current) drug therapy: Secondary | ICD-10-CM | POA: Insufficient documentation

## 2014-02-27 DIAGNOSIS — K219 Gastro-esophageal reflux disease without esophagitis: Secondary | ICD-10-CM | POA: Insufficient documentation

## 2014-02-27 DIAGNOSIS — S4980XA Other specified injuries of shoulder and upper arm, unspecified arm, initial encounter: Secondary | ICD-10-CM | POA: Insufficient documentation

## 2014-02-27 DIAGNOSIS — R109 Unspecified abdominal pain: Secondary | ICD-10-CM | POA: Insufficient documentation

## 2014-02-27 DIAGNOSIS — Y9389 Activity, other specified: Secondary | ICD-10-CM | POA: Insufficient documentation

## 2014-02-27 DIAGNOSIS — X503XXA Overexertion from repetitive movements, initial encounter: Secondary | ICD-10-CM | POA: Insufficient documentation

## 2014-02-27 DIAGNOSIS — R079 Chest pain, unspecified: Secondary | ICD-10-CM | POA: Insufficient documentation

## 2014-02-27 DIAGNOSIS — G8929 Other chronic pain: Secondary | ICD-10-CM | POA: Insufficient documentation

## 2014-02-27 DIAGNOSIS — F329 Major depressive disorder, single episode, unspecified: Secondary | ICD-10-CM | POA: Insufficient documentation

## 2014-02-27 DIAGNOSIS — F17200 Nicotine dependence, unspecified, uncomplicated: Secondary | ICD-10-CM | POA: Insufficient documentation

## 2014-02-27 DIAGNOSIS — S46909A Unspecified injury of unspecified muscle, fascia and tendon at shoulder and upper arm level, unspecified arm, initial encounter: Secondary | ICD-10-CM | POA: Insufficient documentation

## 2014-02-27 DIAGNOSIS — Y929 Unspecified place or not applicable: Secondary | ICD-10-CM | POA: Insufficient documentation

## 2014-02-27 DIAGNOSIS — F3289 Other specified depressive episodes: Secondary | ICD-10-CM | POA: Insufficient documentation

## 2014-02-27 MED ORDER — TRAMADOL HCL 50 MG PO TABS: 50.0000 mg | ORAL_TABLET | Freq: Four times a day (QID) | ORAL | Status: DC | PRN

## 2014-02-27 NOTE — Discharge Instructions (Signed)
Arthralgia  Arthralgia is joint pain. A joint is a place where two bones meet. Joint pain can happen for many reasons. The joint can be bruised, stiff, infected, or weak from aging. Pain usually goes away after resting and taking medicine for soreness.   HOME CARE  · Rest the joint as told by your doctor.  · Keep the sore joint raised (elevated) for the first 24 hours.  · Put ice on the joint area.  · Put ice in a plastic bag.  · Place a towel between your skin and the bag.  · Leave the ice on for 15-20 minutes, 03-04 times a day.  · Wear your splint, casting, elastic bandage, or sling as told by your doctor.  · Only take medicine as told by your doctor. Do not take aspirin.  · Use crutches as told by your doctor. Do not put weight on the joint until told to by your doctor.  GET HELP RIGHT AWAY IF:   · You have bruising, puffiness (swelling), or more pain.  · Your fingers or toes turn blue or start to lose feeling (numb).  · Your medicine does not lessen the pain.  · Your pain becomes severe.  · You have a temperature by mouth above 102° F (38.9° C), not controlled by medicine.  · You cannot move or use the joint.  MAKE SURE YOU:   · Understand these instructions.  · Will watch your condition.  · Will get help right away if you are not doing well or get worse.  Document Released: 09/29/2009 Document Revised: 01/03/2012 Document Reviewed: 09/29/2009  [employer]® Patient Information ©2014 [employer].

## 2014-02-27 NOTE — ED Provider Notes (Signed)
CSN: [id number]     Arrival date & time 02/27/14  1107 History   First MD Initiated Contact with Patient 02/27/14 1122     Chief Complaint  Patient presents with  . Arm Pain     (Consider location/radiation/quality/duration/timing/severity/associated sxs/prior Treatment) HPI Comments: Pt states that last week he started hurting in his left shoulder after some very heavy work. Pt states that he was seen in there er and told that it was strain and he was put on something for pain. Pt states that he went back to doing work yesterday and now his left back and elbow are hurting and he is having tingling in his finger. No numbness or weakness. Tried some tylenol at home. No previous inury  The history is provided by the patient. No language interpreter was used.    Past Medical History  Diagnosis Date  . GERD (gastroesophageal reflux disease)   . Anxiety   . Depression   . Paresthesias   . Chronic abdominal pain   . Chronic chest pain   . Headache   . Normal cardiac stress test 06/2013  . Hx of echocardiogram 07/2013    normal   Past Surgical History  Procedure Laterality Date  . Esophagogastroduodenoscopy N/A 12/06/2013    Procedure: ESOPHAGOGASTRODUODENOSCOPY (EGD);  Surgeon: Malissa HippoNajeeb U Rehman, MD;  Location: [facility];  Service: Endoscopy;  Laterality: N/A;  240-moved to 1240 Ann to notify pt   Family History  Problem Relation Age of Onset  . Bladder Cancer Father   . Hypertension Mother    History  Substance Use Topics  . Smoking status: Current Every Day Smoker -- 1.50 packs/day for 20 years    Types: Cigarettes  . Smokeless tobacco: Never Used     Comment: 1 1/2 pack a day   . Alcohol Use: No    Review of Systems  Constitutional: Negative.   Respiratory: Negative.   Cardiovascular: Negative.       Allergies  Review of patient's allergies indicates no known allergies.  Home Medications   Prior to Admission medications   Medication Sig Start Date End Date  Taking? Authorizing Provider  acetaminophen (TYLENOL) 500 MG tablet Take 1,000 mg by mouth 6 (six) times daily.    Yes Historical Provider, MD  famotidine (PEPCID) 10 MG tablet Take 10 mg by mouth 2 (two) times daily as needed for heartburn or indigestion.    Yes Historical Provider, MD  omeprazole (PRILOSEC) 40 MG capsule Take 40 mg by mouth daily.   Yes Historical Provider, MD   BP 149/95  Pulse 88  Temp(Src) 97.8 F (36.6 C) (Oral)  Resp 18  SpO2 98% Physical Exam  Nursing note and vitals reviewed. Constitutional: He is oriented to person, place, and time. He appears well-developed and well-nourished.  Cardiovascular: Normal rate and regular rhythm.   Pulmonary/Chest: Effort normal and breath sounds normal.  Musculoskeletal:  Full rom or left shoulder and elbow. No gross deformity noted to the area. No redness or warmth noted  Neurological: He is alert and oriented to person, place, and time. Coordination normal.  Skin: Skin is warm and dry.  Psychiatric: He has a normal mood and affect.    ED Course  Procedures (including critical care time) Labs Review Labs Reviewed - No data to display  Imaging Review No results found.   EKG Interpretation None      MDM   Final diagnoses:  Elbow pain    Likely strain. Don't think any need  for imaging at this time.will treat symptomatically and have follow up with ortho. No concern for gout or septic joint    Teressa LowerVrinda Zanyiah Posten, NP 02/27/14 1145
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
## 2014-02-27 NOTE — ED Provider Notes (Signed)
Medical screening examination/treatment/procedure(s) were performed by non-physician practitioner and as supervising physician I was immediately available for consultation/collaboration.   EKG Interpretation None       Donnetta HutchingBrian Thao Bauza, MD 02/27/14 [phone]

## 2014-02-28 ENCOUNTER — Telehealth: Payer: Self-pay | Admitting: Orthopedic Surgery

## 2014-02-28 NOTE — Telephone Encounter (Signed)
Laurie PandaJohnny Desouza called today to request a follow up appointment from the ER for elbow pain.  He has [place] Medicaid .  Told him to contact his PCP office for a referral and once we hear from them we will set up an appointment.

## 2014-03-14 ENCOUNTER — Emergency Department (HOSPITAL_COMMUNITY)
Admission: EM | Admit: 2014-03-14 | Discharge: 2014-03-15 | Disposition: A | Discharge status: Home / Self Care | Payer: Medicaid Other | Attending: Emergency Medicine | Admitting: Emergency Medicine

## 2014-03-14 ENCOUNTER — Emergency Department (HOSPITAL_COMMUNITY): Payer: Medicaid Other

## 2014-03-14 ENCOUNTER — Encounter (HOSPITAL_COMMUNITY): Payer: Self-pay | Admitting: Emergency Medicine

## 2014-03-14 DIAGNOSIS — F17200 Nicotine dependence, unspecified, uncomplicated: Secondary | ICD-10-CM | POA: Insufficient documentation

## 2014-03-14 DIAGNOSIS — R0789 Other chest pain: Secondary | ICD-10-CM

## 2014-03-14 DIAGNOSIS — K219 Gastro-esophageal reflux disease without esophagitis: Secondary | ICD-10-CM | POA: Insufficient documentation

## 2014-03-14 DIAGNOSIS — M79609 Pain in unspecified limb: Secondary | ICD-10-CM | POA: Insufficient documentation

## 2014-03-14 DIAGNOSIS — Z79899 Other long term (current) drug therapy: Secondary | ICD-10-CM | POA: Insufficient documentation

## 2014-03-14 DIAGNOSIS — R071 Chest pain on breathing: Secondary | ICD-10-CM | POA: Insufficient documentation

## 2014-03-14 DIAGNOSIS — G8929 Other chronic pain: Secondary | ICD-10-CM | POA: Insufficient documentation

## 2014-03-14 DIAGNOSIS — Z9889 Other specified postprocedural states: Secondary | ICD-10-CM | POA: Insufficient documentation

## 2014-03-14 DIAGNOSIS — Z8659 Personal history of other mental and behavioral disorders: Secondary | ICD-10-CM | POA: Insufficient documentation

## 2014-03-14 DIAGNOSIS — I10 Essential (primary) hypertension: Secondary | ICD-10-CM | POA: Insufficient documentation

## 2014-03-14 LAB — CBC WITH DIFFERENTIAL/PLATELET
Basophils Absolute: 0 10*3/uL (ref 0.0–0.1)
Basophils Relative: 0 % (ref 0–1)
EOS PCT: 0 % (ref 0–5)
Eosinophils Absolute: 0 10*3/uL (ref 0.0–0.7)
HEMATOCRIT: 46.5 % (ref 39.0–52.0)
HEMOGLOBIN: 15.7 g/dL (ref 13.0–17.0)
LYMPHS ABS: 1.5 10*3/uL (ref 0.7–4.0)
LYMPHS PCT: 13 % (ref 12–46)
MCH: 29.7 pg (ref 26.0–34.0)
MCHC: 33.8 g/dL (ref 30.0–36.0)
MCV: 88.1 fL (ref 78.0–100.0)
MONOS PCT: 2 % — AB (ref 3–12)
Monocytes Absolute: 0.3 10*3/uL (ref 0.1–1.0)
NEUTROS ABS: 9.5 10*3/uL — AB (ref 1.7–7.7)
Neutrophils Relative %: 85 % — ABNORMAL HIGH (ref 43–77)
Platelets: 314 10*3/uL (ref 150–400)
RBC: 5.28 MIL/uL (ref 4.22–5.81)
RDW: 12.4 % (ref 11.5–15.5)
WBC: 11.2 10*3/uL — AB (ref 4.0–10.5)

## 2014-03-14 LAB — COMPREHENSIVE METABOLIC PANEL
ALT: 24 U/L (ref 0–53)
AST: 15 U/L (ref 0–37)
Albumin: 4.1 g/dL (ref 3.5–5.2)
Alkaline Phosphatase: 57 U/L (ref 39–117)
BILIRUBIN TOTAL: 0.3 mg/dL (ref 0.3–1.2)
BUN: 18 mg/dL (ref 6–23)
CALCIUM: 9.5 mg/dL (ref 8.4–10.5)
CHLORIDE: 101 meq/L (ref 96–112)
CO2: 23 mEq/L (ref 19–32)
Creatinine, Ser: 0.7 mg/dL (ref 0.50–1.35)
GLUCOSE: 160 mg/dL — AB (ref 70–99)
Potassium: 4.5 mEq/L (ref 3.7–5.3)
Sodium: 138 mEq/L (ref 137–147)
Total Protein: 7.5 g/dL (ref 6.0–8.3)

## 2014-03-14 LAB — D-DIMER, QUANTITATIVE (NOT AT ARMC)

## 2014-03-14 LAB — TROPONIN I

## 2014-03-14 MED ORDER — OXYCODONE-ACETAMINOPHEN 5-325 MG PO TABS
1.0000 | ORAL_TABLET | Freq: Once | ORAL | Status: AC
  Administered 2014-03-14: 1 via ORAL
  Filled 2014-03-14: qty 1

## 2014-03-14 MED ORDER — TRAMADOL HCL 50 MG PO TABS: 50.0000 mg | ORAL_TABLET | Freq: Four times a day (QID) | ORAL | Status: DC | PRN

## 2014-03-14 NOTE — Discharge Instructions (Signed)
Follow up with your md next week. °

## 2014-03-14 NOTE — ED Provider Notes (Signed)
CSN: [id number]     Arrival date & time 03/14/14  1959 History  This chart was scribed for Benny LennertJoseph L Haille Pardi, MD by Quintella ReichertMatthew Underwood, ED scribe.  This patient was seen in room APA06/APA06 and the patient's care was started at 9:24 PM.   Chief Complaint  Patient presents with  . Chest Pain    Patient is a 33 y.o. male presenting with chest pain. The history is provided by the patient and medical records. No language interpreter was used.  Chest Pain Pain location:  L chest Pain quality comment:  Pulling Pain radiates to:  L shoulder, upper back and neck Pain radiates to the back: yes   Pain severity:  Moderate Duration:  2 days Chronicity:  Recurrent Exacerbated by: walking. Ineffective treatments: holding his fist under his left armpit. Associated symptoms: no abdominal pain, no cough, no fatigue and no headache   Risk factors: hypertension, male sex, obesity and smoking     HPI Comments: Marco Martinez is a 33 y.o. male with h/o HTN, chronic chest pain, hiatal hernia, GERD, and anxiety who presents to the Emergency Department complaining of recurrent left-sided chest pain that began 2 days ago.  Pt describes pain as worst across his left upper chest and under the left breast, but "pulling" on the left side of his neck, left shoulder and left upper back.  Pain is worsened by walking and he states he has to hold his fist under his left armpit due to pain when he is walking.  Pt reports transient episodes of similar pain for the past 10 months but states his current episode is much more persistent.  He has been seen for this pain before and had a normal stress test and echocardiogram and 2014.  He states his pain stopped in early 2015 but has since returned and is at least as bad as before.  He notes that 2 weeks ago he experienced some pain radiating from his left shoulder into his left elbow, and was seen by an orthopedist who told him this arm pain likely originated in his neck but that his  chest pain was likely a separate issues and is most likely cardiac.  Pt is a current every-day smoker.  He is not employed currently.     Past Medical History  Diagnosis Date  . GERD (gastroesophageal reflux disease)   . Anxiety   . Depression   . Paresthesias   . Chronic abdominal pain   . Chronic chest pain   . Headache   . Normal cardiac stress test 06/2013  . Hx of echocardiogram 07/2013    normal  . Hypertension     Past Surgical History  Procedure Laterality Date  . Esophagogastroduodenoscopy N/A 12/06/2013    Procedure: ESOPHAGOGASTRODUODENOSCOPY (EGD);  Surgeon: Malissa HippoNajeeb U Rehman, MD;  Location: [facility];  Service: Endoscopy;  Laterality: N/A;  240-moved to 1240 Ann to notify pt    Family History  Problem Relation Age of Onset  . Bladder Cancer Father   . Hypertension Mother     History  Substance Use Topics  . Smoking status: Current Every Day Smoker -- 1.50 packs/day for 20 years    Types: Cigarettes  . Smokeless tobacco: Never Used     Comment: 1 1/2 pack a day   . Alcohol Use: No    Review of Systems  Constitutional: Negative for appetite change and fatigue.  HENT: Negative for congestion, ear discharge and sinus pressure.   Eyes: Negative  for discharge.  Respiratory: Negative for cough.   Cardiovascular: Positive for chest pain.  Gastrointestinal: Negative for abdominal pain and diarrhea.  Genitourinary: Negative for frequency and hematuria.  Skin: Negative for rash.  Neurological: Negative for seizures and headaches.  Psychiatric/Behavioral: Negative for hallucinations.      Allergies  Review of patient's allergies indicates no known allergies.  Home Medications   Prior to Admission medications   Medication Sig Start Date End Date Taking? Authorizing Provider  acetaminophen (TYLENOL) 500 MG tablet Take 1,000 mg by mouth 6 (six) times daily.     Historical Provider, MD  famotidine (PEPCID) 10 MG tablet Take 10 mg by mouth 2 (two) times daily  as needed for heartburn or indigestion.     Historical Provider, MD  omeprazole (PRILOSEC) 40 MG capsule Take 40 mg by mouth daily.    Historical Provider, MD  traMADol (ULTRAM) 50 MG tablet Take 1 tablet (50 mg total) by mouth every 6 (six) hours as needed. 02/27/14   Teressa Lower, NP   BP 154/92  Pulse 83  Temp(Src) 98 F (36.7 C) (Oral)  Resp 20  Ht 6\' 1"  (1.854 m)  Wt 232 lb (105.235 kg)  BMI 30.62 kg/m2  SpO2 97%  Physical Exam  Nursing note and vitals reviewed. Constitutional: He is oriented to person, place, and time. He appears well-developed.  HENT:  Head: Normocephalic.  Eyes: Conjunctivae and EOM are normal. No scleral icterus.  Neck: Neck supple. No thyromegaly present.  Cardiovascular: Normal rate and regular rhythm.  Exam reveals no gallop and no friction rub.   No murmur heard. Pulmonary/Chest: No stridor. He has no wheezes. He has no rales. He exhibits no tenderness.  Abdominal: He exhibits no distension. There is no tenderness. There is no rebound.  Musculoskeletal: Normal range of motion. He exhibits no edema.  Minor tenderness under left axilla  Lymphadenopathy:    He has no cervical adenopathy.  Neurological: He is oriented to person, place, and time. He exhibits normal muscle tone. Coordination normal.  Skin: No rash noted. No erythema.  Psychiatric: He has a normal mood and affect. His behavior is normal.    ED Course  Procedures (including critical care time)  DIAGNOSTIC STUDIES: Oxygen Saturation is 97% on room air, normal by my interpretation.    COORDINATION OF CARE: 9:27 PM-Discussed treatment plan which includes pain medication, EKG, CXR, and labs with pt at bedside and pt agreed to plan.     Labs Review Labs Reviewed  CBC WITH DIFFERENTIAL - Abnormal; Notable for the following:    WBC 11.2 (*)    Neutrophils Relative % 85 (*)    Neutro Abs 9.5 (*)    Monocytes Relative 2 (*)    All other components within normal limits  COMPREHENSIVE  METABOLIC PANEL - Abnormal; Notable for the following:    Glucose, Bld 160 (*)    All other components within normal limits  TROPONIN I  D-DIMER, QUANTITATIVE    Imaging Review Dg Chest 2 View  03/14/2014   CLINICAL DATA:  Chest pain.  EXAM: CHEST  2 VIEW  COMPARISON:  February 20, 2014.  FINDINGS: The heart size and mediastinal contours are within normal limits. Both lungs are clear. No pneumothorax or pleural effusion is noted. The visualized skeletal structures are unremarkable.  IMPRESSION: No acute cardiopulmonary abnormality seen.   Electronically Signed   By: Roque Lias M.D.   On: 03/14/2014 21:05     EKG Interpretation   Date/Time:  Thursday Mar 14 2014 20:09:52 EDT Ventricular Rate:  76 PR Interval:  128 QRS Duration: 98 QT Interval:  370 QTC Calculation: 416 R Axis:   70 Text Interpretation:  Normal sinus rhythm Normal ECG Confirmed by Margerie Fraiser   MD, Landyn Buckalew ([id number]) on 03/14/2014 11:34:48 PM      MDM   Final diagnoses:  None   The chart was scribed for me under my direct supervision.  I personally performed the history, physical, and medical decision making and all procedures in the evaluation of this patient.Benny Lennert.    Breezy Hertenstein L Tramar Brueckner, MD 03/14/14 [phone]

## 2014-03-14 NOTE — ED Notes (Signed)
Chest and back pain for 2 days. , n/v for 2 days.

## 2014-03-27 ENCOUNTER — Encounter: Payer: Self-pay | Admitting: Cardiology

## 2014-03-27 NOTE — Progress Notes (Signed)
Clinical Summary Marco Martinez is a 33 y.o.male referred to the office by Dr. Case secondary to chest pain. Primary care physician is Dr. Olena Leatherwood. I do not have complete records. He was seen in the ER on May 21 with chest pain, evaluated by Dr. Estell Harpin. ECG was completely normal at that time. Lab work showed normal d-dimer, normal troponin I, WBC 11.2, hemoglobin 15.7, platelets 314, potassium 4.5, BUN 18, creatinine 0.7, normal LFTs. Chest x-ray indicated no acute cardiopulmonary process.  He describes a nearly constant heaviness in his chest and left shoulder area. There was some concern that he may have torn his rotator cuff, however I am told that his orthopedic physician does not feel that this is the case, and the patient has been told that this is "probably his heart."  He was seen in consultation in our office back in September with atypical chest pain and has already undergone cardiac testing for further evaluation. Lexiscan Cardiolite from August 2014 showed no diagnostic ST segment changes, soft tissue attenuation without active ischemia, LVEF 59%. Subsequent to that he had an echocardiogram in October 2014 showing LVEF 55-60% with no wall motion abnormalities, normal diastolic function, no major valvular abnormalities, and no pericardial effusion. Since that time he has had GI workup as well, only diagnosis is hiatal hernia.   He does have some family history of heart disease in his father.  Today I reviewed his testing, both recent and from last year. Description of symptoms is very atypical for ischemic heart disease, however the patient is very anxious about this and would like to proceed with more definitive assessment. He has already undergone a noninvasive nuclear study within the last year. He would like to proceed with a diagnostic heart catheterization. I discussed the potential risks and anticipated benefits, and he is in agreement. This will be arranged for early next week.   No  Known Allergies  Current Outpatient Prescriptions  Medication Sig Dispense Refill  . acetaminophen (TYLENOL) 500 MG tablet Take 1,000 mg by mouth every 4 (four) hours as needed for moderate pain or headache.       . bisoprolol-hydrochlorothiazide (ZIAC) 2.5-6.25 MG per tablet Take 1 tablet by mouth daily.      . famotidine (PEPCID) 10 MG tablet Take 10 mg by mouth as needed for heartburn or indigestion.        No current facility-administered medications for this visit.    Past Medical History  Diagnosis Date  . GERD (gastroesophageal reflux disease)   . Anxiety   . Depression   . Paresthesias   . Chronic abdominal pain   . Chronic chest pain   . Headache   . Essential hypertension, benign     Social History [name] reports that he has been smoking Cigarettes.  He has a 30 pack-year smoking history. He has never used smokeless tobacco. [name] reports that he does not drink alcohol.  Review of Systems No palpitations, dizziness, syncope. No fevers or chills. No orthopnea. Otherwise as outlined above.  Physical Examination Filed Vitals:   03/28/14 1428  BP: 142/82  Pulse: 69   Filed Weights   03/28/14 1428  Weight: 241 lb 6.4 oz (109.498 kg)    No distress. HEENT: Conjunctiva and lids normal, oropharynx clear.  Neck: Supple, no elevated JVP or carotid bruits, no thyromegaly.  Lungs: Clear to auscultation, nonlabored breathing at rest.  Cardiac: Regular rate and rhythm, no S3 or significant systolic murmur, no pericardial rub.  Abdomen: Soft, nontender, no hepatomegaly, bowel sounds present, no guarding or rebound.  Extremities: No pitting edema, distal pulses 2+.  Skin: Warm and dry.  Musculoskeletal: No kyphosis.  Neuropsychiatric: Alert and oriented x3, affect grossly appropriate.   Problem List and Plan   Precordial pain This has been a recurring progressive problem as outlined. Previous noninvasive cardiac workup was reassuring as of last year.  Interval GI workup showed only hiatal hernia. Patient is very concerned about his heart as discussed. Description of symptoms is actually very atypical for ischemic heart disease, recent ECG was normal as was troponin I and d-dimer. He would like to proceed with a diagnostic cardiac catheterization for definitive exclusion of obstructive CAD that may have been missed by noninvasive imaging. I discussed the potential risks and anticipated benefits, and he would like to have this scheduled for early next week.  Tobacco abuse Smoking cessation has been discussed.  Elevated blood pressure Keep followup with Dr. Olena LeatherwoodHasanaj.    Jonelle SidleSamuel G. Miliyah Luper, M.D., F.A.C.C.

## 2014-03-28 ENCOUNTER — Other Ambulatory Visit: Payer: Self-pay | Admitting: Cardiology

## 2014-03-28 ENCOUNTER — Encounter: Payer: Self-pay | Admitting: *Deleted

## 2014-03-28 ENCOUNTER — Encounter: Payer: Self-pay | Admitting: Cardiology

## 2014-03-28 ENCOUNTER — Ambulatory Visit (INDEPENDENT_AMBULATORY_CARE_PROVIDER_SITE_OTHER): Payer: Medicaid Other | Admitting: Cardiology

## 2014-03-28 ENCOUNTER — Telehealth: Payer: Self-pay | Admitting: Cardiology

## 2014-03-28 VITALS — BP 142/82 | HR 69 | Ht 72.0 in | Wt 241.4 lb

## 2014-03-28 DIAGNOSIS — F17200 Nicotine dependence, unspecified, uncomplicated: Secondary | ICD-10-CM

## 2014-03-28 DIAGNOSIS — R030 Elevated blood-pressure reading, without diagnosis of hypertension: Secondary | ICD-10-CM

## 2014-03-28 DIAGNOSIS — R072 Precordial pain: Secondary | ICD-10-CM

## 2014-03-28 DIAGNOSIS — IMO0001 Reserved for inherently not codable concepts without codable children: Secondary | ICD-10-CM

## 2014-03-28 DIAGNOSIS — Z720 Tobacco use: Secondary | ICD-10-CM

## 2014-03-28 NOTE — Telephone Encounter (Signed)
Left heart cath Tuesday, April 02, 2014 @1 :30 pm with Dr. Excell Seltzer LX:BWIOM pain

## 2014-03-28 NOTE — Assessment & Plan Note (Signed)
Keep followup with Dr. Olena Leatherwood.

## 2014-03-28 NOTE — Telephone Encounter (Signed)
Pt has Medicaid only.  No precert only.

## 2014-03-28 NOTE — Assessment & Plan Note (Signed)
Smoking cessation has been discussed. 

## 2014-03-28 NOTE — Assessment & Plan Note (Signed)
This has been a recurring progressive problem as outlined. Previous noninvasive cardiac workup was reassuring as of last year. Interval GI workup showed only hiatal hernia. Patient is very concerned about his heart as discussed. Description of symptoms is actually very atypical for ischemic heart disease, recent ECG was normal as was troponin I and d-dimer. He would like to proceed with a diagnostic cardiac catheterization for definitive exclusion of obstructive CAD that may have been missed by noninvasive imaging. I discussed the potential risks and anticipated benefits, and he would like to have this scheduled for early next week.

## 2014-03-28 NOTE — Patient Instructions (Signed)
Your physician recommends that you continue on your current medications as directed. Please refer to the Current Medication list given to you today. Your physician has requested that you have a cardiac catheterization. Cardiac catheterization is used to diagnose and/or treat various heart conditions. Doctors may recommend this procedure for a number of different reasons. The most common reason is to evaluate chest pain. Chest pain can be a symptom of coronary artery disease (CAD), and cardiac catheterization can show whether plaque is narrowing or blocking your heart's arteries. This procedure is also used to evaluate the valves, as well as measure the blood flow and oxygen levels in different parts of your heart. For further information please visit www.cardiosmart.org. Please follow instruction sheet, as given.   

## 2014-04-01 ENCOUNTER — Encounter (HOSPITAL_COMMUNITY): Payer: Self-pay | Admitting: Pharmacy Technician

## 2014-04-02 ENCOUNTER — Encounter (HOSPITAL_COMMUNITY)
Admission: RE | Disposition: A | Discharge status: Home / Self Care | Payer: Self-pay | Source: Ambulatory Visit | Attending: Cardiovascular Disease

## 2014-04-02 ENCOUNTER — Ambulatory Visit (HOSPITAL_COMMUNITY)
Admission: RE | Admit: 2014-04-02 | Discharge: 2014-04-02 | Disposition: A | Discharge status: Home / Self Care | Payer: Medicaid Other | Source: Ambulatory Visit | Attending: Cardiovascular Disease | Admitting: Cardiovascular Disease

## 2014-04-02 DIAGNOSIS — R072 Precordial pain: Secondary | ICD-10-CM | POA: Insufficient documentation

## 2014-04-02 DIAGNOSIS — Z8249 Family history of ischemic heart disease and other diseases of the circulatory system: Secondary | ICD-10-CM | POA: Insufficient documentation

## 2014-04-02 DIAGNOSIS — F17200 Nicotine dependence, unspecified, uncomplicated: Secondary | ICD-10-CM | POA: Insufficient documentation

## 2014-04-02 DIAGNOSIS — R079 Chest pain, unspecified: Secondary | ICD-10-CM

## 2014-04-02 DIAGNOSIS — R51 Headache: Secondary | ICD-10-CM | POA: Insufficient documentation

## 2014-04-02 DIAGNOSIS — G8929 Other chronic pain: Secondary | ICD-10-CM | POA: Insufficient documentation

## 2014-04-02 DIAGNOSIS — F3289 Other specified depressive episodes: Secondary | ICD-10-CM | POA: Insufficient documentation

## 2014-04-02 DIAGNOSIS — F329 Major depressive disorder, single episode, unspecified: Secondary | ICD-10-CM | POA: Insufficient documentation

## 2014-04-02 DIAGNOSIS — R109 Unspecified abdominal pain: Secondary | ICD-10-CM | POA: Insufficient documentation

## 2014-04-02 DIAGNOSIS — K219 Gastro-esophageal reflux disease without esophagitis: Secondary | ICD-10-CM | POA: Insufficient documentation

## 2014-04-02 DIAGNOSIS — F411 Generalized anxiety disorder: Secondary | ICD-10-CM | POA: Insufficient documentation

## 2014-04-02 DIAGNOSIS — I10 Essential (primary) hypertension: Secondary | ICD-10-CM | POA: Insufficient documentation

## 2014-04-02 HISTORY — PX: LEFT HEART CATHETERIZATION WITH CORONARY ANGIOGRAM: SHX5451

## 2014-04-02 LAB — BASIC METABOLIC PANEL
BUN: 19 mg/dL (ref 6–23)
CALCIUM: 9.6 mg/dL (ref 8.4–10.5)
CHLORIDE: 101 meq/L (ref 96–112)
CO2: 26 meq/L (ref 19–32)
Creatinine, Ser: 0.83 mg/dL (ref 0.50–1.35)
GFR calc Af Amer: >90 mL/min (ref >90)
GFR calc non Af Amer: >90 mL/min (ref >90)
GLUCOSE: 97 mg/dL (ref 70–99)
Potassium: 4.1 mEq/L (ref 3.7–5.3)
SODIUM: 141 meq/L (ref 137–147)

## 2014-04-02 LAB — CBC
HEMATOCRIT: 45.9 % (ref 39.0–52.0)
Hemoglobin: 15.8 g/dL (ref 13.0–17.0)
MCH: 30.7 pg (ref 26.0–34.0)
MCHC: 34.4 g/dL (ref 30.0–36.0)
MCV: 89.3 fL (ref 78.0–100.0)
PLATELETS: 240 10*3/uL (ref 150–400)
RBC: 5.14 MIL/uL (ref 4.22–5.81)
RDW: 12.8 % (ref 11.5–15.5)
WBC: 9.6 10*3/uL (ref 4.0–10.5)

## 2014-04-02 LAB — PROTIME-INR
INR: 0.98 (ref 0.00–1.49)
Prothrombin Time: 12.8 seconds (ref 11.6–15.2)

## 2014-04-02 SURGERY — LEFT HEART CATHETERIZATION WITH CORONARY ANGIOGRAM
Anesthesia: LOCAL

## 2014-04-02 MED ORDER — ONDANSETRON HCL 4 MG/2ML IJ SOLN: 4.0000 mg | Freq: Four times a day (QID) | INTRAMUSCULAR | Status: DC | PRN

## 2014-04-02 MED ORDER — SODIUM CHLORIDE 0.9 % IJ SOLN: 3.0000 mL | Freq: Two times a day (BID) | INTRAMUSCULAR | Status: DC

## 2014-04-02 MED ORDER — FENTANYL CITRATE 0.05 MG/ML IJ SOLN
INTRAMUSCULAR | Status: AC
  Filled 2014-04-02: qty 2

## 2014-04-02 MED ORDER — HEPARIN (PORCINE) IN NACL 2-0.9 UNIT/ML-% IJ SOLN
INTRAMUSCULAR | Status: AC
  Filled 2014-04-02: qty 1500

## 2014-04-02 MED ORDER — NITROGLYCERIN 0.4 MG SL SUBL
SUBLINGUAL_TABLET | SUBLINGUAL | Status: AC
  Filled 2014-04-02: qty 1

## 2014-04-02 MED ORDER — SODIUM CHLORIDE 0.9 % IJ SOLN: 3.0000 mL | INTRAMUSCULAR | Status: DC | PRN

## 2014-04-02 MED ORDER — LIDOCAINE HCL (PF) 1 % IJ SOLN
INTRAMUSCULAR | Status: AC
  Filled 2014-04-02: qty 30

## 2014-04-02 MED ORDER — ACETAMINOPHEN 325 MG PO TABS: 650.0000 mg | ORAL_TABLET | ORAL | Status: DC | PRN

## 2014-04-02 MED ORDER — SODIUM CHLORIDE 0.9 % IV SOLN: 250.0000 mL | INTRAVENOUS | Status: DC | PRN

## 2014-04-02 MED ORDER — VERAPAMIL HCL 2.5 MG/ML IV SOLN
INTRAVENOUS | Status: AC
  Filled 2014-04-02: qty 2

## 2014-04-02 MED ORDER — MIDAZOLAM HCL 2 MG/2ML IJ SOLN
INTRAMUSCULAR | Status: AC
  Filled 2014-04-02: qty 2

## 2014-04-02 MED ORDER — NITROGLYCERIN 0.2 MG/ML ON CALL CATH LAB
INTRAVENOUS | Status: AC
  Filled 2014-04-02: qty 1

## 2014-04-02 MED ORDER — SODIUM CHLORIDE 0.9 % IV SOLN
INTRAVENOUS | Status: DC
  Administered 2014-04-02: 13:00:00 via INTRAVENOUS

## 2014-04-02 MED ORDER — SODIUM CHLORIDE 0.9 % IV SOLN: INTRAVENOUS | Status: AC

## 2014-04-02 MED ORDER — ASPIRIN 81 MG PO CHEW
81.0000 mg | CHEWABLE_TABLET | ORAL | Status: AC
  Administered 2014-04-02: 81 mg via ORAL
  Filled 2014-04-02: qty 1

## 2014-04-02 MED ORDER — HEPARIN SODIUM (PORCINE) 1000 UNIT/ML IJ SOLN
INTRAMUSCULAR | Status: AC
  Filled 2014-04-02: qty 1

## 2014-04-02 NOTE — Interval H&P Note (Signed)
History and Physical Interval Note:  04/02/2014 1:26 PM  Marco Martinez  has presented today for surgery, with the diagnosis of cp  The various methods of treatment have been discussed with the patient and family. After consideration of risks, benefits and other options for treatment, the patient has consented to  Procedure(s): LEFT HEART CATHETERIZATION WITH CORONARY ANGIOGRAM (N/A) as a surgical intervention .  The patient's history has been reviewed, patient examined, no change in status, stable for surgery.  I have reviewed the patient's chart and labs.  Questions were answered to the patient's satisfaction.    Cath Lab Visit (complete for each Cath Lab visit)  Clinical Evaluation Leading to the Procedure:   ACS: no  Non-ACS:    Anginal Classification: CCS III  Anti-ischemic medical therapy: Minimal Therapy (1 class of medications)  Non-Invasive Test Results: No non-invasive testing performed  Prior CABG: No previous CABG       Tonny Bollman

## 2014-04-02 NOTE — CV Procedure (Signed)
    Cardiac Catheterization Procedure Note  Name: Marco Martinez MRN: [id number] DOB: 27-Mar-1981  Procedure: Left Heart Cath, Selective Coronary Angiography, LV angiography  Indication: Substernal chest pain, ongoing and progressive symptoms. Strong cardiovascular risk factors including heavy tobacco use and family history of premature CAD.   Procedural Details: The right wrist was prepped, draped, and anesthetized with 1% lidocaine. Using the modified Seldinger technique, a 5 French sheath was introduced into the right radial artery. 3 mg of verapamil was administered through the sheath, weight-based unfractionated heparin was administered intravenously. Standard Judkins catheters were used for selective coronary angiography and left ventriculography. Catheter exchanges were performed over an exchange length guidewire. There were no immediate procedural complications. A TR band was used for radial hemostasis at the completion of the procedure.  The patient was transferred to the post catheterization recovery area for further monitoring.  Procedural Findings: Hemodynamics: AO 111/63 LV 114/11  Coronary angiography: Coronary dominance: right  Left mainstem: The left circumflex and LAD have separate ostia. 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  Left anterior descending (LAD): This is an angiographically normal vessel. The LAD is large with multiple septal perforators. The first diagonal gives off 20 branches and the second diagonal has no obstructive disease.  Left circumflex (LCx): Large, dominant vessel. No obstructive disease noted. The OM branches are patent. The left PDA and PLA branches are patent.  Right coronary artery (RCA): Small, nondominant vessel. 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  Left ventriculography: Left ventricular systolic function is normal, LVEF is estimated at 55-65%, there is no significant mitral regurgitation    Final Conclusions:   1. No evidence of atherosclerotic coronary artery disease 2. Normal LV systolic function 3. 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  Tonny Bollman 04/02/2014, 2:10 PM

## 2014-04-02 NOTE — H&P (View-Only) (Signed)
Clinical Summary Marco Martinez is a 33 y.o.male referred to the office by Dr. Case secondary to chest pain. Primary care physician is Dr. Olena Martinez. I do not have complete records. He was seen in the ER on May 21 with chest pain, evaluated by Dr. Estell Martinez. ECG was completely normal at that time. Lab work showed normal d-dimer, normal troponin I, WBC 11.2, hemoglobin 15.7, platelets 314, potassium 4.5, BUN 18, creatinine 0.7, normal LFTs. Chest x-ray indicated no acute cardiopulmonary process.  He describes a nearly constant heaviness in his chest and left shoulder area. There was some concern that he may have torn his rotator cuff, however I am told that his orthopedic physician does not feel that this is the Marco Martinez, and the patient has been told that this is "probably his heart."  He was seen in consultation in our office back in September with atypical chest pain and has already undergone cardiac testing for further evaluation. Lexiscan Cardiolite from August 2014 showed no diagnostic ST segment changes, soft tissue attenuation without active ischemia, LVEF 59%. Subsequent to that he had an echocardiogram in October 2014 showing LVEF 55-60% with no wall motion abnormalities, normal diastolic function, no major valvular abnormalities, and no pericardial effusion. Since that time he has had GI workup as well, only diagnosis is hiatal hernia.   He does have some family history of heart disease in his Marco Martinez.  Today I reviewed his testing, both recent and from last year. Description of symptoms is very atypical for ischemic heart disease, however the patient is very anxious about this and would like to proceed with more definitive assessment. He has already undergone a noninvasive nuclear study within the last year. He would like to proceed with a diagnostic heart catheterization. I discussed the potential risks and anticipated benefits, and he is in agreement. This will be arranged for early next week.   No  Known Allergies  Current Outpatient Prescriptions  Medication Sig Dispense Refill  . acetaminophen (TYLENOL) 500 MG tablet Take 1,000 mg by mouth every 4 (four) hours as needed for moderate pain or headache.       . bisoprolol-hydrochlorothiazide (ZIAC) 2.5-6.25 MG per tablet Take 1 tablet by mouth daily.      . famotidine (PEPCID) 10 MG tablet Take 10 mg by mouth as needed for heartburn or indigestion.        No current facility-administered medications for this visit.    Past Medical History  Diagnosis Date  . GERD (gastroesophageal reflux disease)   . Anxiety   . Depression   . Paresthesias   . Chronic abdominal pain   . Chronic chest pain   . Headache   . Essential hypertension, benign     Social History [name] reports that he has been smoking Cigarettes.  He has a 30 pack-year smoking history. He has never used smokeless tobacco. [name] reports that he does not drink alcohol.  Review of Systems No palpitations, dizziness, syncope. No fevers or chills. No orthopnea. Otherwise as outlined above.  Physical Examination Filed Vitals:   03/28/14 1428  BP: 142/82  Pulse: 69   Filed Weights   03/28/14 1428  Weight: 241 lb 6.4 oz (109.498 kg)    No distress. HEENT: Conjunctiva and lids normal, oropharynx clear.  Neck: Supple, no elevated JVP or carotid bruits, no thyromegaly.  Lungs: Clear to auscultation, nonlabored breathing at rest.  Cardiac: Regular rate and rhythm, no S3 or significant systolic murmur, no pericardial rub.  Abdomen: Soft, nontender, no hepatomegaly, bowel sounds present, no guarding or rebound.  Extremities: No pitting edema, distal pulses 2+.  Skin: Warm and dry.  Musculoskeletal: No kyphosis.  Neuropsychiatric: Alert and oriented x3, affect grossly appropriate.   Problem List and Plan   Precordial pain This has been a recurring progressive problem as outlined. Previous noninvasive cardiac workup was reassuring as of last year.  Interval GI workup showed only hiatal hernia. Patient is very concerned about his heart as discussed. Description of symptoms is actually very atypical for ischemic heart disease, recent ECG was normal as was troponin I and d-dimer. He would like to proceed with a diagnostic cardiac catheterization for definitive exclusion of obstructive CAD that may have been missed by noninvasive imaging. I discussed the potential risks and anticipated benefits, and he would like to have this scheduled for early next week.  Tobacco abuse Smoking cessation has been discussed.  Elevated blood pressure Keep followup with Dr. Hasanaj.    Marco Martinez, M.D., F.A.C.C.   

## 2014-04-02 NOTE — Discharge Instructions (Signed)

## 2014-04-02 NOTE — Progress Notes (Signed)
DR Excell Seltzer IN AND NOTIFIED OF C/O CHEST PAIN AND PER DR COOPER NO NTG GIVEN

## 2014-08-29 IMAGING — CT CT HEAD W/O CM
1 series · 16 of 30 positions shown, 20 images · non-contrast
Comparison: None.

CLINICAL DATA: Head numbness for several days. No injury.

EXAM:
CT HEAD WITHOUT CONTRAST
TECHNIQUE: Contiguous axial images were obtained from the base of the skull
through the vertex without intravenous contrast.

[Series 2: headtrauma 4.8 h37s · axial · 0.44mm/px · z∈[+95,+232]mm · 16 of 30 slices shown, 20 images]
[im 2/30  brain]
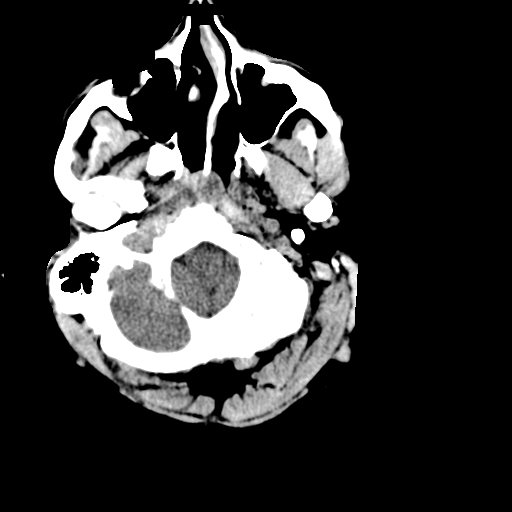
[im 2/30  bone]
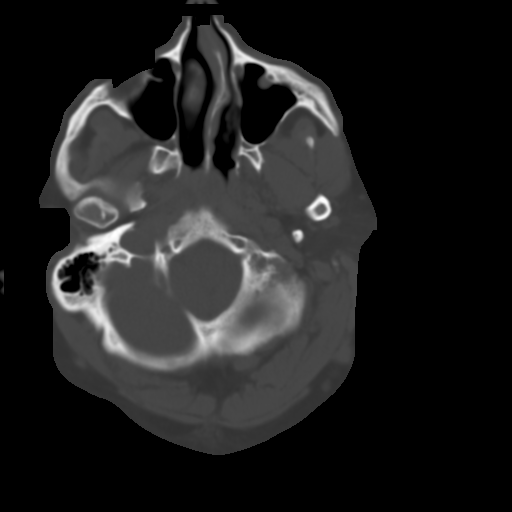
[im 4/30  brain]
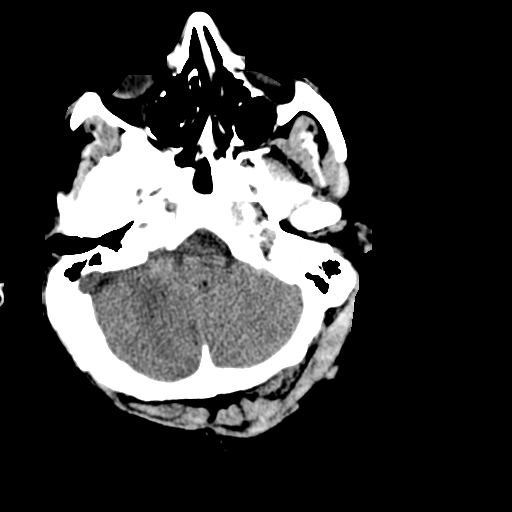
[im 6/30  brain]
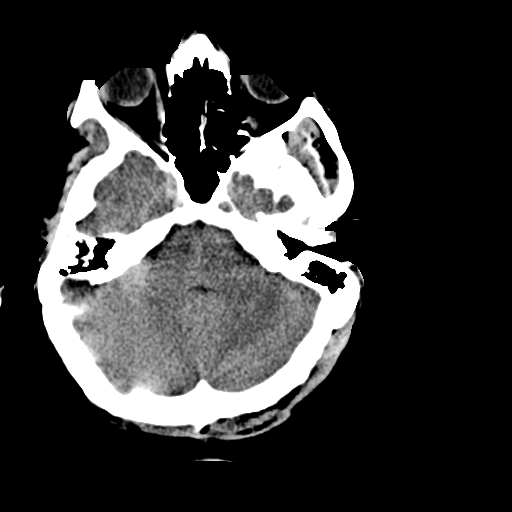
[im 8/30  brain]
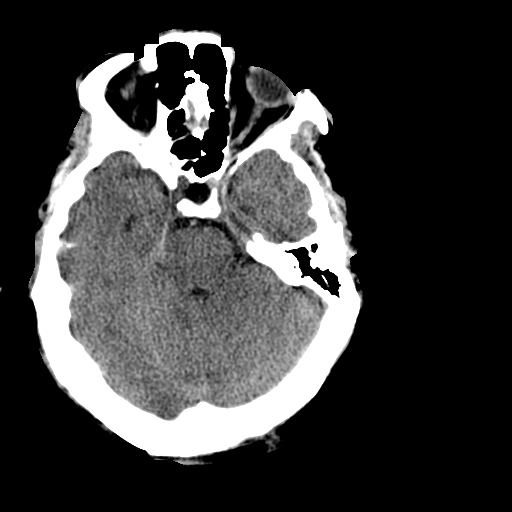
[im 9/30  brain]
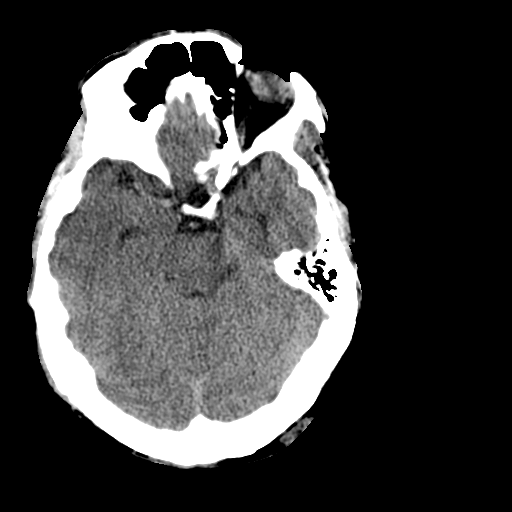
[im 9/30  bone]
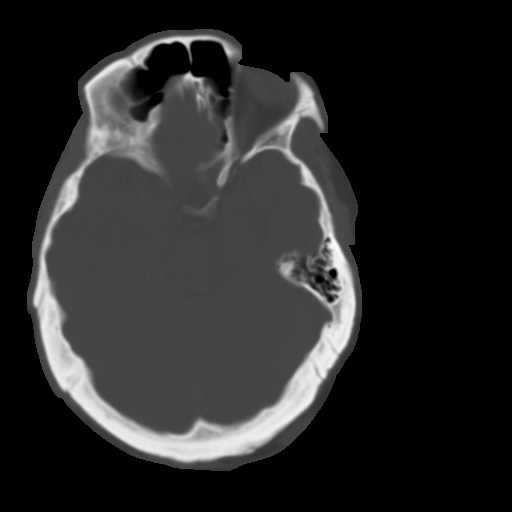
[im 11/30  brain]
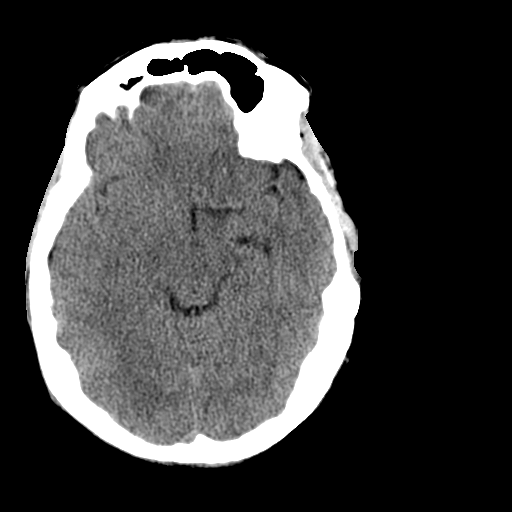
[im 13/30  brain]
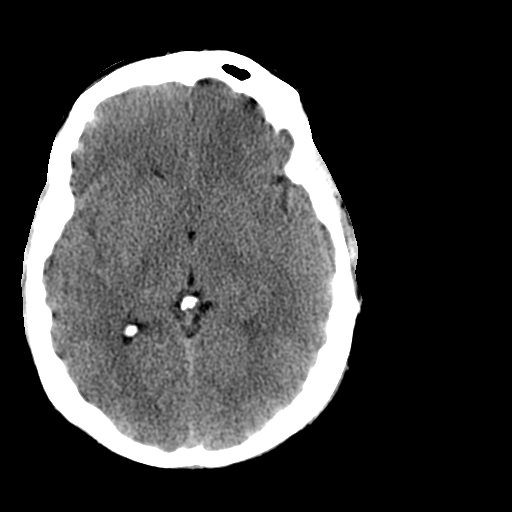
[im 15/30  brain]
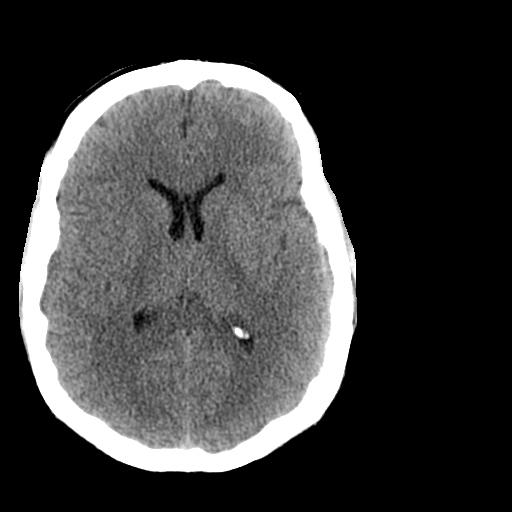
[im 16/30  brain]
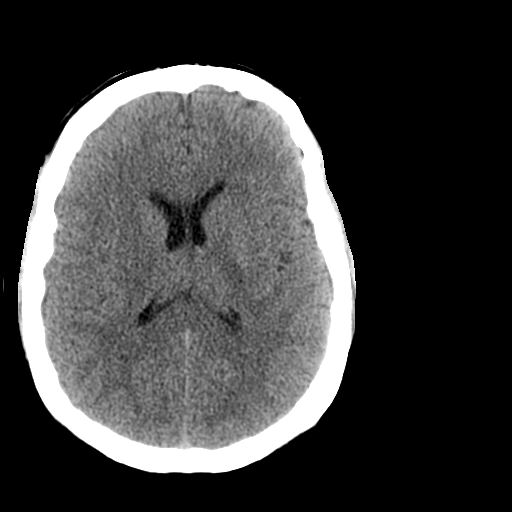
[im 16/30  bone]
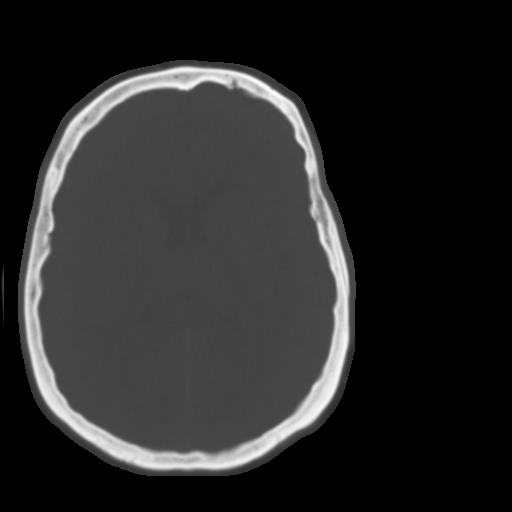
[im 18/30  brain]
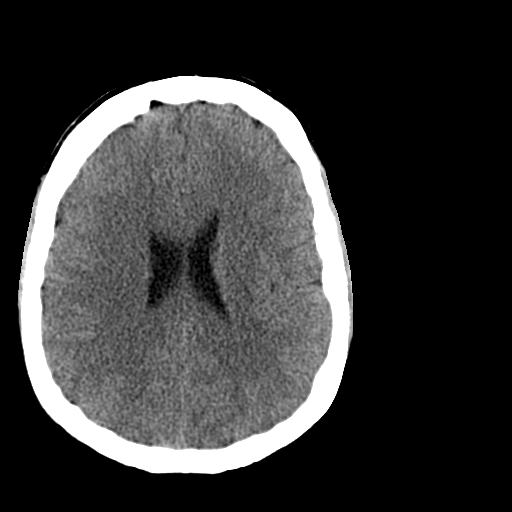
[im 20/30  brain]
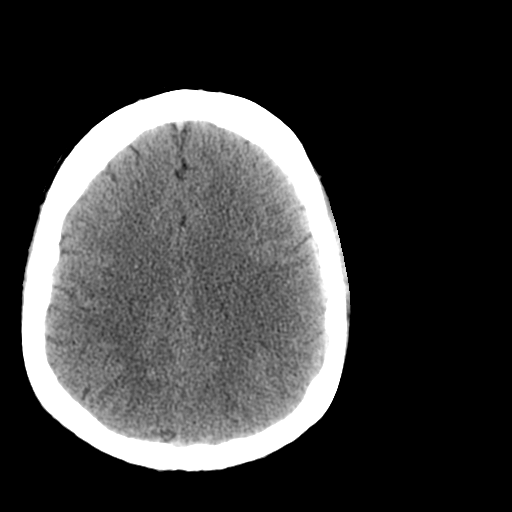
[im 22/30  brain]
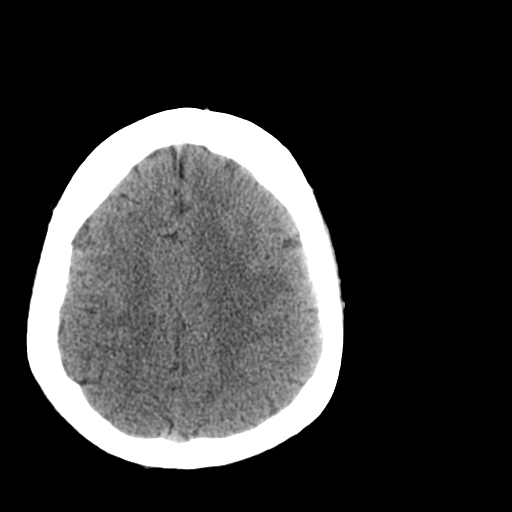
[im 23/30  brain]
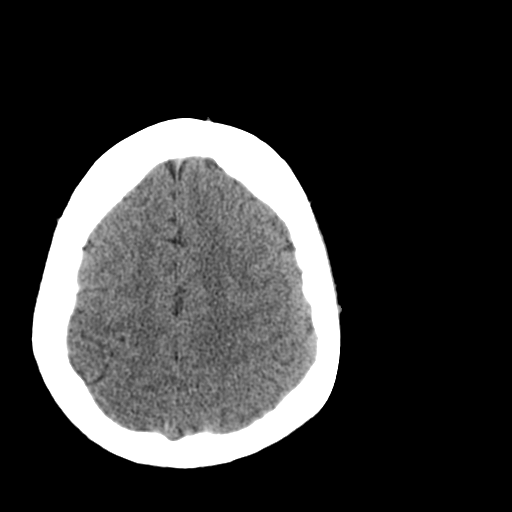
[im 23/30  bone]
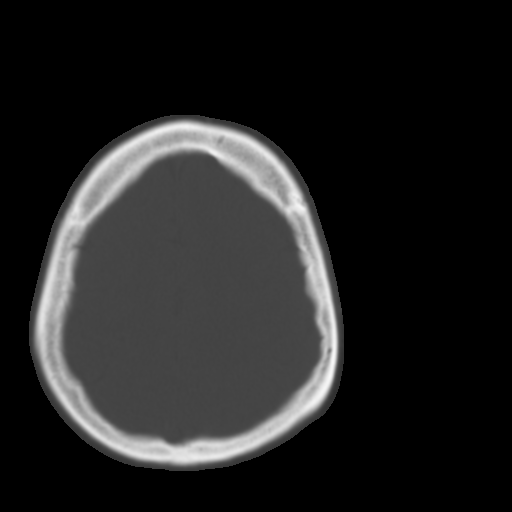
[im 25/30  brain]
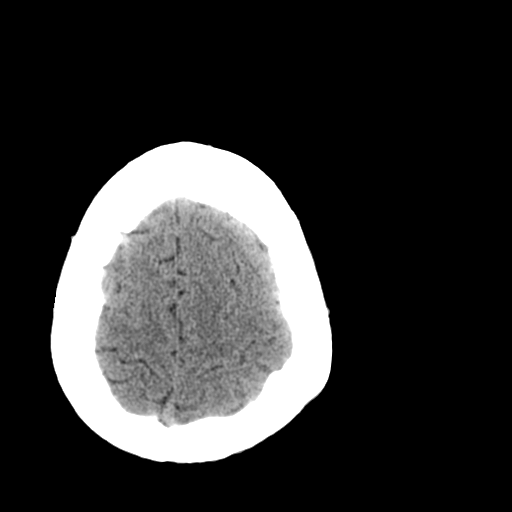
[im 27/30  brain]
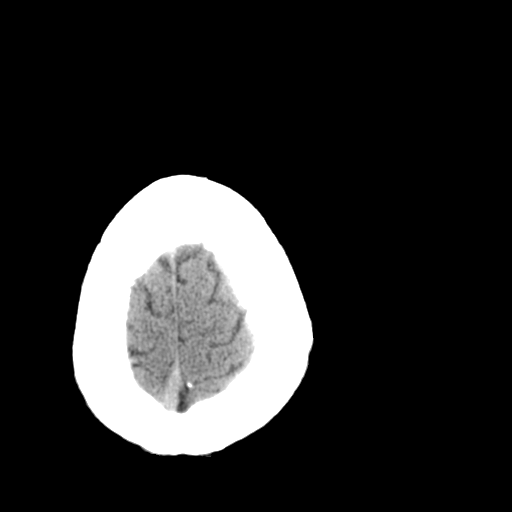
[im 29/30  brain]
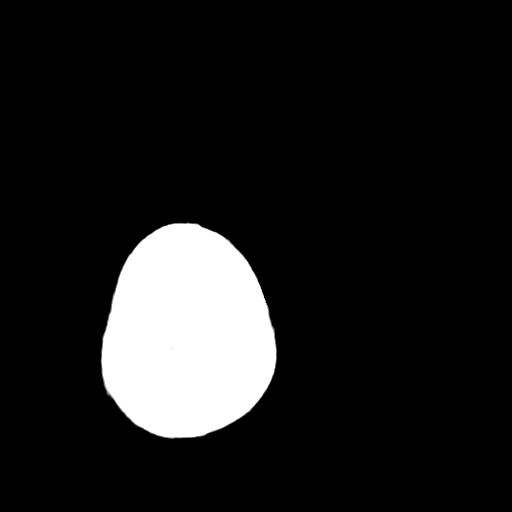

[16 of 30 positions shown; findings below may reference images not displayed]

FINDINGS: The ventricles are normal in size and configuration.

There are no parenchymal masses or mass effect. There are no areas
of abnormal parenchymal attenuation. There is no evidence of an
infarct.

There are no extra-axial masses or abnormal fluid collections.

There is no intracranial hemorrhage.

The visualized sinuses and mastoid air cells are clear.
IMPRESSION: Normal unenhanced CT scan of the brain.

## 2014-08-29 IMAGING — CR DG CHEST 2V
2 series · 2 of 2 positions shown · non-contrast
Comparison: 07/02/2013

CLINICAL DATA: Chest pain, leg pain

EXAM:
CHEST  2 VIEW

[view not recorded (1 of 2)]
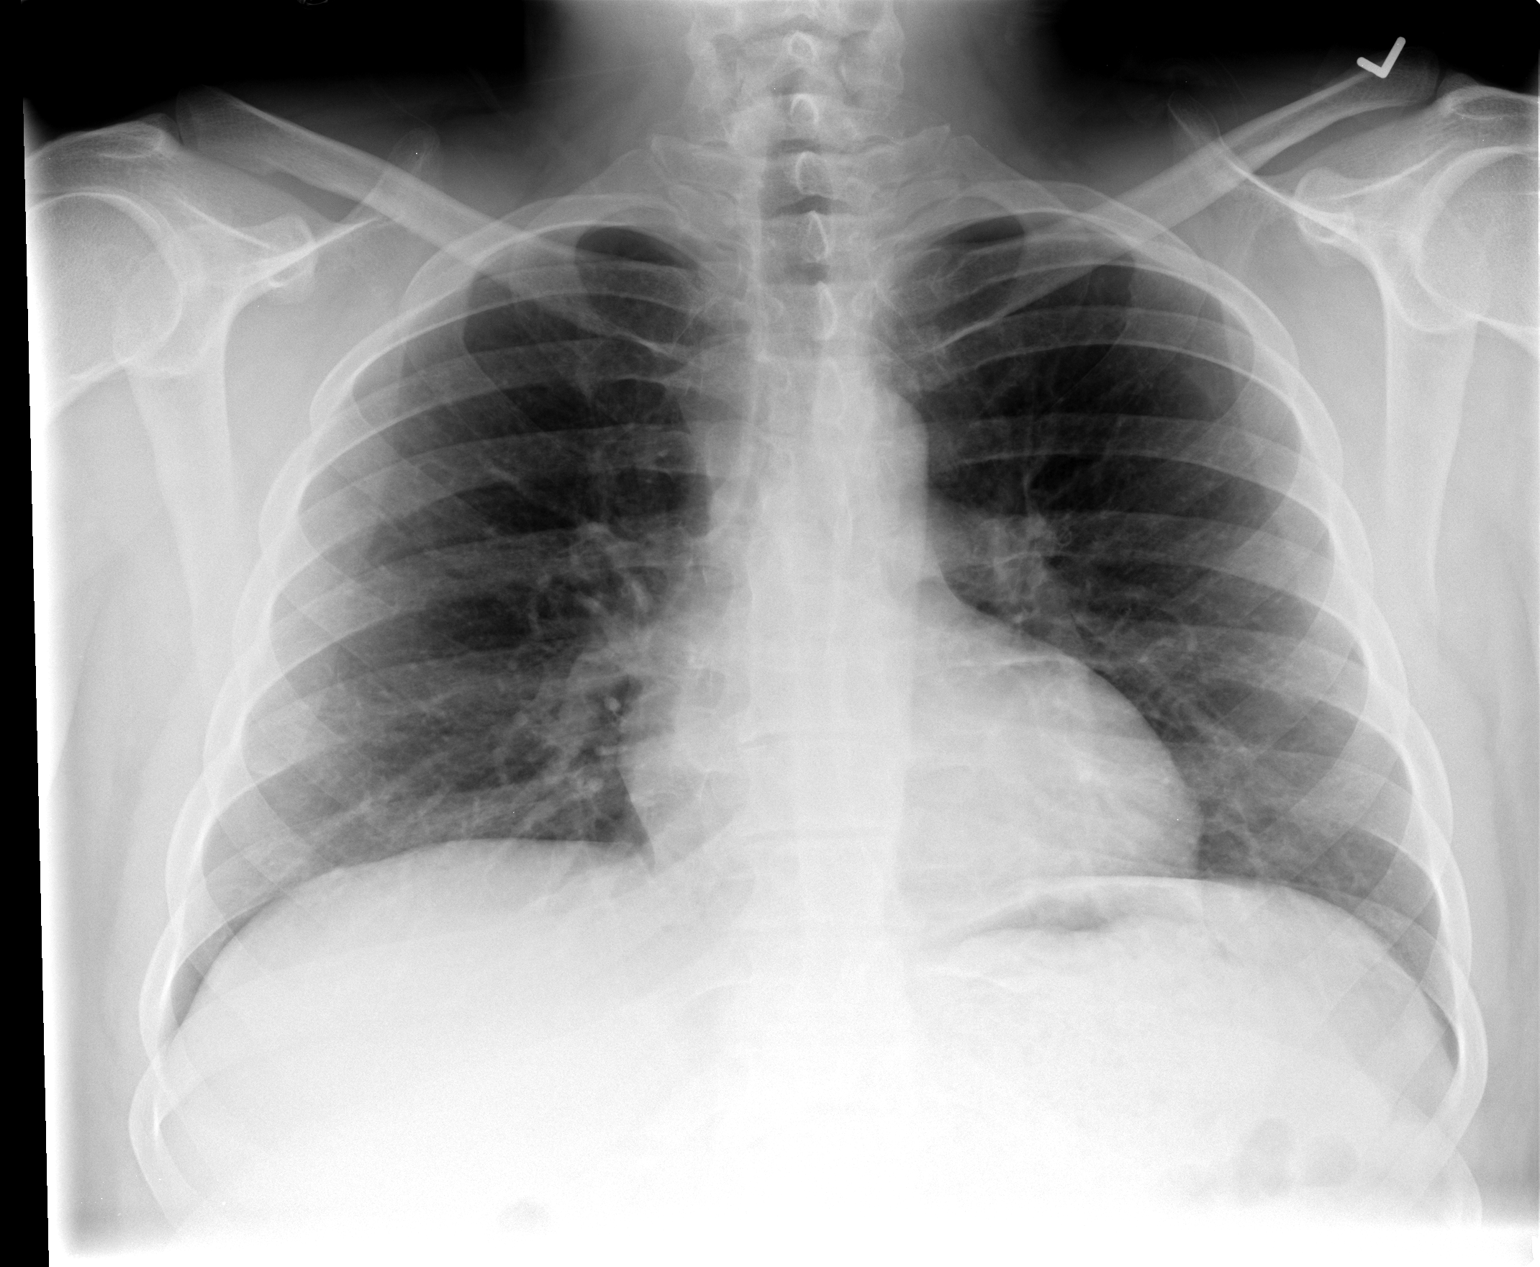

[view not recorded (2 of 2)]
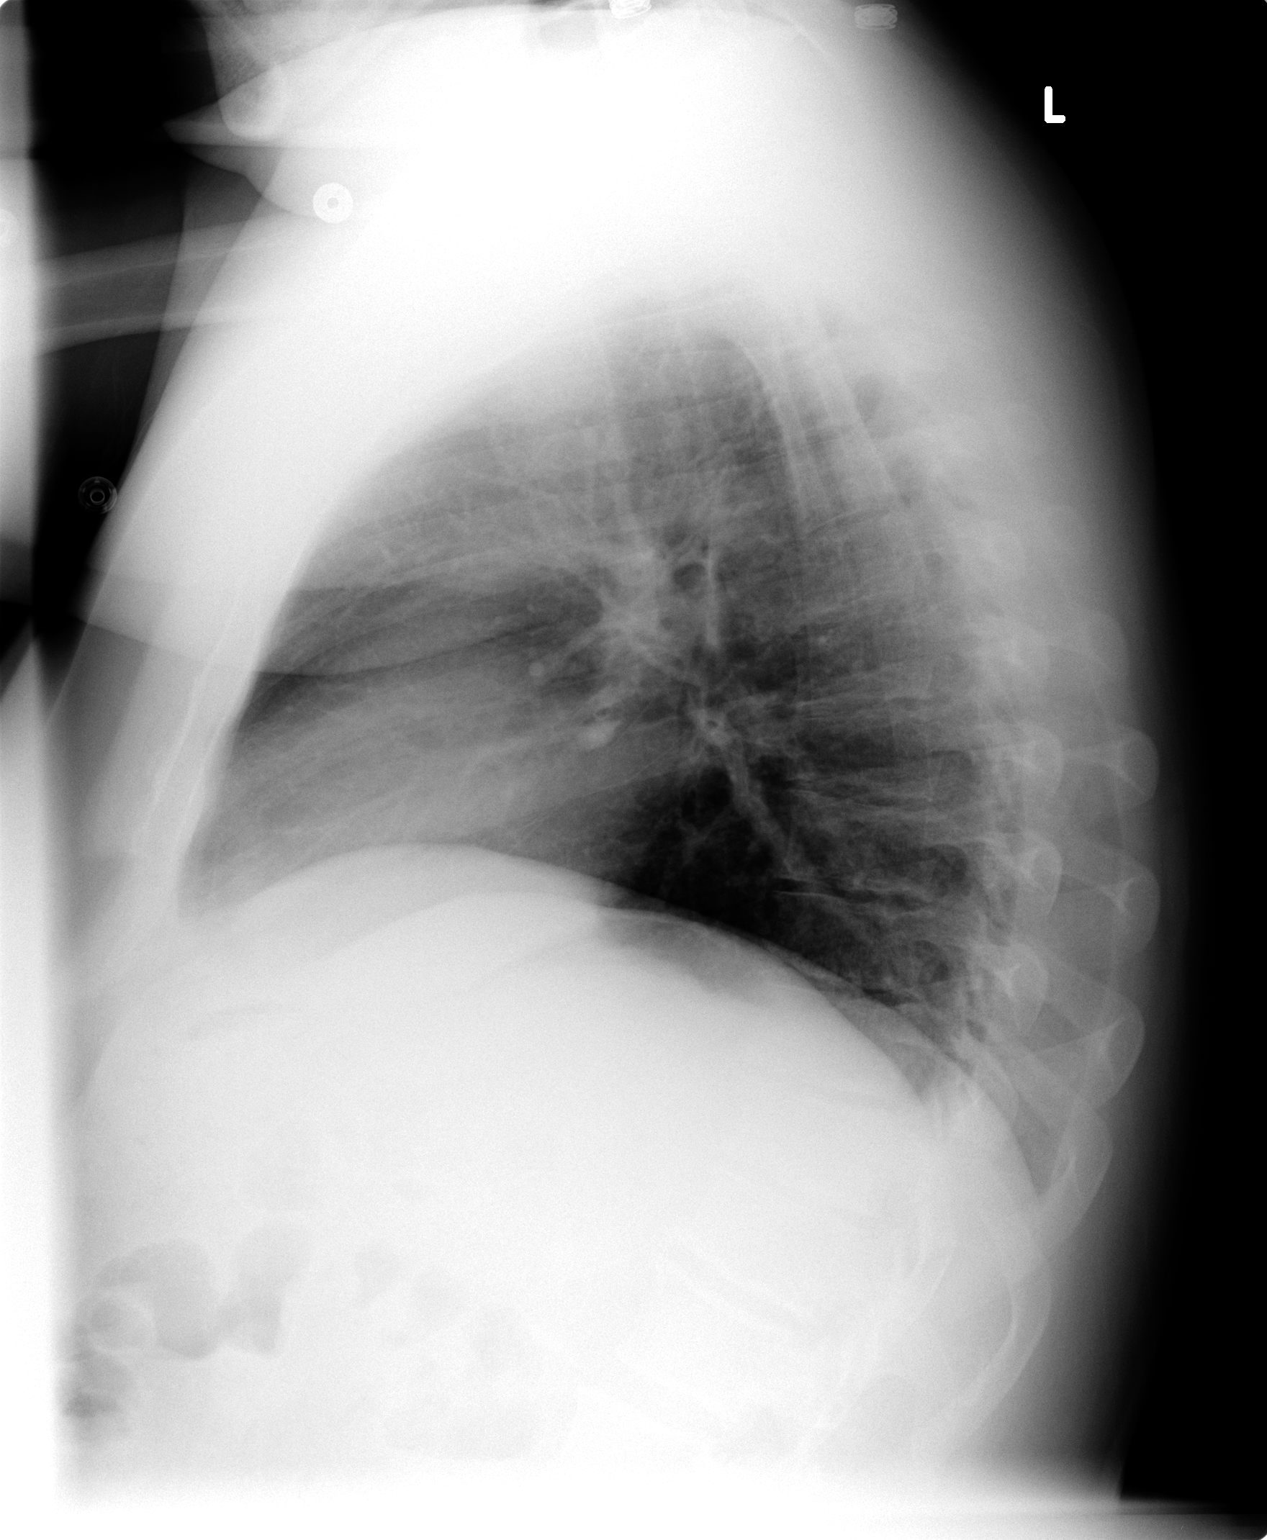

[2 of 2 positions shown; findings below may reference images not displayed]

FINDINGS: Upper-normal size of cardiac silhouette.

Mediastinal contours and pulmonary vascularity normal.

Minimal peribronchial thickening.

Lungs otherwise clear.

No pleural effusion or pneumothorax.

Bones stable with mild chronic anterior height loss of a lower
thoracic vertebra.
IMPRESSION: Minimal bronchitic changes without infiltrate.

## 2014-10-03 ENCOUNTER — Encounter (HOSPITAL_COMMUNITY): Payer: Self-pay | Admitting: Cardiovascular Disease

## 2014-12-27 ENCOUNTER — Encounter (HOSPITAL_COMMUNITY): Payer: Self-pay | Admitting: Emergency Medicine

## 2014-12-27 ENCOUNTER — Emergency Department (HOSPITAL_COMMUNITY)
Admission: EM | Admit: 2014-12-27 | Discharge: 2014-12-27 | Disposition: A | Discharge status: Home / Self Care | Payer: PRIVATE HEALTH INSURANCE | Attending: Emergency Medicine | Admitting: Emergency Medicine

## 2014-12-27 DIAGNOSIS — Z720 Tobacco use: Secondary | ICD-10-CM | POA: Diagnosis not present

## 2014-12-27 DIAGNOSIS — K2970 Gastritis, unspecified, without bleeding: Secondary | ICD-10-CM | POA: Diagnosis not present

## 2014-12-27 DIAGNOSIS — Z7952 Long term (current) use of systemic steroids: Secondary | ICD-10-CM | POA: Diagnosis not present

## 2014-12-27 DIAGNOSIS — Z7982 Long term (current) use of aspirin: Secondary | ICD-10-CM | POA: Insufficient documentation

## 2014-12-27 DIAGNOSIS — E663 Overweight: Secondary | ICD-10-CM | POA: Diagnosis not present

## 2014-12-27 DIAGNOSIS — K625 Hemorrhage of anus and rectum: Secondary | ICD-10-CM | POA: Diagnosis not present

## 2014-12-27 DIAGNOSIS — Z8659 Personal history of other mental and behavioral disorders: Secondary | ICD-10-CM | POA: Insufficient documentation

## 2014-12-27 DIAGNOSIS — K219 Gastro-esophageal reflux disease without esophagitis: Secondary | ICD-10-CM | POA: Diagnosis not present

## 2014-12-27 DIAGNOSIS — I10 Essential (primary) hypertension: Secondary | ICD-10-CM | POA: Diagnosis not present

## 2014-12-27 DIAGNOSIS — Z9889 Other specified postprocedural states: Secondary | ICD-10-CM | POA: Insufficient documentation

## 2014-12-27 DIAGNOSIS — Z79899 Other long term (current) drug therapy: Secondary | ICD-10-CM | POA: Insufficient documentation

## 2014-12-27 DIAGNOSIS — G8929 Other chronic pain: Secondary | ICD-10-CM | POA: Insufficient documentation

## 2014-12-27 DIAGNOSIS — K921 Melena: Secondary | ICD-10-CM | POA: Diagnosis present

## 2014-12-27 LAB — CBC WITH DIFFERENTIAL/PLATELET
BASOS ABS: 0.1 10*3/uL (ref 0.0–0.1)
Basophils Relative: 1 % (ref 0–1)
Eosinophils Absolute: 0.2 10*3/uL (ref 0.0–0.7)
Eosinophils Relative: 2 % (ref 0–5)
HEMATOCRIT: 46.1 % (ref 39.0–52.0)
Hemoglobin: 15.3 g/dL (ref 13.0–17.0)
LYMPHS PCT: 30 % (ref 12–46)
Lymphs Abs: 2.7 10*3/uL (ref 0.7–4.0)
MCH: 30.2 pg (ref 26.0–34.0)
MCHC: 33.2 g/dL (ref 30.0–36.0)
MCV: 91.1 fL (ref 78.0–100.0)
Monocytes Absolute: 0.4 10*3/uL (ref 0.1–1.0)
Monocytes Relative: 5 % (ref 3–12)
Neutro Abs: 5.5 10*3/uL (ref 1.7–7.7)
Neutrophils Relative %: 62 % (ref 43–77)
PLATELETS: 273 10*3/uL (ref 150–400)
RBC: 5.06 MIL/uL (ref 4.22–5.81)
RDW: 12.7 % (ref 11.5–15.5)
WBC: 8.8 10*3/uL (ref 4.0–10.5)

## 2014-12-27 LAB — COMPREHENSIVE METABOLIC PANEL
ALT: 28 U/L (ref 0–53)
AST: 18 U/L (ref 0–37)
Albumin: 4.3 g/dL (ref 3.5–5.2)
Alkaline Phosphatase: 51 U/L (ref 39–117)
Anion gap: 6 (ref 5–15)
BILIRUBIN TOTAL: 0.4 mg/dL (ref 0.3–1.2)
BUN: 13 mg/dL (ref 6–23)
CALCIUM: 8.8 mg/dL (ref 8.4–10.5)
CO2: 28 mmol/L (ref 19–32)
Chloride: 106 mmol/L (ref 96–112)
Creatinine, Ser: 0.95 mg/dL (ref 0.50–1.35)
Glucose, Bld: 102 mg/dL — ABNORMAL HIGH (ref 70–99)
Potassium: 4.1 mmol/L (ref 3.5–5.1)
SODIUM: 140 mmol/L (ref 135–145)
Total Protein: 7 g/dL (ref 6.0–8.3)

## 2014-12-27 MED ORDER — HYDROCORTISONE ACETATE 25 MG RE SUPP: 25.0000 mg | Freq: Two times a day (BID) | RECTAL | Status: DC

## 2014-12-27 MED ORDER — ONDANSETRON HCL 4 MG/2ML IJ SOLN
4.0000 mg | Freq: Once | INTRAMUSCULAR | Status: AC
  Administered 2014-12-27: 4 mg via INTRAVENOUS
  Filled 2014-12-27: qty 2

## 2014-12-27 MED ORDER — FAMOTIDINE 20 MG PO TABS: 20.0000 mg | ORAL_TABLET | Freq: Two times a day (BID) | ORAL | Status: DC

## 2014-12-27 MED ORDER — PROMETHAZINE HCL 25 MG PO TABS: 25.0000 mg | ORAL_TABLET | Freq: Four times a day (QID) | ORAL | Status: DC | PRN

## 2014-12-27 MED ORDER — PANTOPRAZOLE SODIUM 40 MG IV SOLR
40.0000 mg | Freq: Once | INTRAVENOUS | Status: AC
  Administered 2014-12-27: 40 mg via INTRAVENOUS
  Filled 2014-12-27: qty 40

## 2014-12-27 MED ORDER — SODIUM CHLORIDE 0.9 % IV BOLUS (SEPSIS)
1000.0000 mL | Freq: Once | INTRAVENOUS | Status: AC
  Administered 2014-12-27: 1000 mL via INTRAVENOUS

## 2014-12-27 NOTE — ED Provider Notes (Signed)
CSN: [id number]     Arrival date & time 12/27/14  0717 History  This chart was scribed for Donnetta HutchingBrian Mosie Angus, MD by Gwenyth Oberatherine Macek, ED Scribe. This patient was seen in room [facility] and the patient's care was started at 7:45 AM.    Chief Complaint  Patient presents with  . GI Bleeding   The history is provided by the patient. No language interpreter was used.    HPI Comments: Marco Martinez is a 34 y.o. male with a history of CP who presents to the Emergency Department complaining of 1 episode of hematochezia and hematemesis that occurred this morning. Pt describes vomiting as sour-tasting, pink-tinged brown and orange liquid. The blood in his stool was bright red and turned the toilet water a medium red hue. He notes subjective fever, abdominal pain and light-headedness that started earlier this week, but are currently resolved, as associated symptoms. Pt reports taking 2-3 Goody Powders every day for pain. He has not eaten PTA.  Pt has a history of heart catheterization, which was negative. He smokes cigarettes.   PCP Hasanaj  Past Medical History  Diagnosis Date  . GERD (gastroesophageal reflux disease)   . Anxiety   . Depression   . Paresthesias   . Chronic abdominal pain   . Chronic chest pain   . Headache   . Essential hypertension, benign    Past Surgical History  Procedure Laterality Date  . Esophagogastroduodenoscopy N/A 12/06/2013    Procedure: ESOPHAGOGASTRODUODENOSCOPY (EGD);  Surgeon: Malissa HippoNajeeb U Rehman, MD;  Location: [facility];  Service: Endoscopy;  Laterality: N/A;  240-moved to 1240 Ann to notify pt  . Left heart catheterization with coronary angiogram N/A 04/02/2014    Procedure: LEFT HEART CATHETERIZATION WITH CORONARY ANGIOGRAM;  Surgeon: Micheline ChapmanMichael D Cooper, MD;  [facility];  Service: Cardiovascular;  Laterality: N/A;   Family History  Problem Relation Age of Onset  . Bladder Cancer Father   . Hypertension Mother    History  Substance Use Topics  .  Smoking status: Current Every Day Smoker -- 1.50 packs/day for 20 years    Types: Cigarettes  . Smokeless tobacco: Never Used     Comment: 1 1/2 pack a day   . Alcohol Use: No    Review of Systems A complete 10 system review of systems was obtained and all systems are negative except as noted in the HPI and PMH.    Allergies  Review of patient's allergies indicates no known allergies.  Home Medications   Prior to Admission medications   Medication Sig Start Date End Date Taking? Authorizing Provider  acetaminophen (TYLENOL) 500 MG tablet Take 1,000 mg by mouth every 4 (four) hours as needed for moderate pain or headache.    Yes Historical Provider, MD  Aspirin-Acetaminophen (GOODYS BODY PAIN PO) Take 1 Package by mouth 3 (three) times daily as needed (pain).   Yes Historical Provider, MD  calcium carbonate (TUMS - DOSED IN MG ELEMENTAL CALCIUM) 500 MG chewable tablet Chew 1 tablet by mouth 2 (two) times daily as needed for indigestion or heartburn.   Yes Historical Provider, MD  Menthol, Topical Analgesic, (ICY HOT EX) Apply 1 application topically 2 (two) times daily as needed (for pain).   Yes Historical Provider, MD  famotidine (PEPCID) 20 MG tablet Take 1 tablet (20 mg total) by mouth 2 (two) times daily. 12/27/14   Donnetta HutchingBrian Miller Edgington, MD  hydrocortisone (ANUSOL-HC) 25 MG suppository Place 1 suppository (25 mg total) rectally 2 (two)  times daily. 12/27/14   [name], MD  promethazine (PHENERGAN) 25 MG tablet Take 1 tablet (25 mg total) by mouth every 6 (six) hours as needed. 12/27/14   [name], MD   BP 130/80 mmHg  Pulse 78  Temp(Src) 98.3 F (36.8 C) (Oral)  Resp 18  Ht 6' (1.829 m)  Wt 250 lb (113.399 kg)  BMI 33.90 kg/m2  SpO2 100% Physical Exam  Constitutional: He is oriented to person, place, and time. He appears well-developed and well-nourished.  Overweight  HENT:  Head: Normocephalic and atraumatic.  Eyes: Conjunctivae and EOM are normal. Pupils are equal, round, and  reactive to light.  Neck: Normal range of motion. Neck supple.  Cardiovascular: Normal rate and regular rhythm.   Pulmonary/Chest: Effort normal and breath sounds normal.  Abdominal: Soft. Bowel sounds are normal. There is no tenderness.  Non-tender in his abdomen  Genitourinary:  No masses and minimally heme positive  Musculoskeletal: Normal range of motion.  Neurological: He is alert and oriented to person, place, and time.  Skin: Skin is warm and dry.  Psychiatric: He has a normal mood and affect. His behavior is normal.  Nursing note and vitals reviewed.   ED Course  Procedures  DIAGNOSTIC STUDIES: Oxygen Saturation is 98% on RA, normal by my interpretation.    COORDINATION OF CARE: 7:53 AM Discussed treatment plan with pt which includes lab work. Pt agreed to plan.  Labs Review Labs Reviewed  COMPREHENSIVE METABOLIC PANEL - Abnormal; Notable for the following:    Glucose, Bld 102 (*)    All other components within normal limits  CBC WITH DIFFERENTIAL/PLATELET  POC OCCULT BLOOD, ED    Imaging Review No results found.   EKG Interpretation None      MDM   Final diagnoses:  Gastritis  Rectal bleed   Patient's hemodynamic be stable. Hemoglobin normal. Rectal exam shows a very minimal amount of heme positive stool. No vomiting or diarrhea in the emergency department. Discharge medications Phenergan 25 mg, Pepcid 20 mg, Anusol suppositories. Follow-up with gastroenterologist. Phone number given.  I personally performed the services described in this documentation, which was scribed in my presence. The recorded information has been reviewed and is accurate.    [name], MD 12/27/14 1210

## 2014-12-27 NOTE — Discharge Instructions (Signed)
Avoid rich greasy foods. Medication for nausea, stomach irritation, rectal bleeding. Follow-up with intestinal doctor. Phone number given.

## 2014-12-27 NOTE — ED Notes (Signed)
Labs drawn with iv insertion

## 2014-12-27 NOTE — ED Notes (Signed)
Patient complaining of rectal bleeding and vomiting blood starting this morning. Patient report abdominal pain x 1 week. [facility] takes [facility] powder "right much."

## 2015-01-03 ENCOUNTER — Emergency Department (HOSPITAL_COMMUNITY)
Admission: EM | Admit: 2015-01-03 | Discharge: 2015-01-03 | Disposition: A | Discharge status: Home / Self Care | Payer: PRIVATE HEALTH INSURANCE

## 2015-01-03 NOTE — ED Notes (Signed)
Called for pt x 2 

## 2015-01-03 NOTE — ED Notes (Signed)
Called for pt x 1.  

## 2015-01-03 NOTE — ED Notes (Signed)
Called x3. No answer 

## 2016-03-04 ENCOUNTER — Emergency Department (HOSPITAL_COMMUNITY)
Admission: EM | Admit: 2016-03-04 | Discharge: 2016-03-04 | Disposition: A | Discharge status: Home / Self Care | Payer: PRIVATE HEALTH INSURANCE | Attending: Emergency Medicine | Admitting: Emergency Medicine

## 2016-03-04 ENCOUNTER — Emergency Department (HOSPITAL_COMMUNITY): Payer: PRIVATE HEALTH INSURANCE

## 2016-03-04 ENCOUNTER — Encounter (HOSPITAL_COMMUNITY): Payer: Self-pay | Admitting: Emergency Medicine

## 2016-03-04 DIAGNOSIS — F329 Major depressive disorder, single episode, unspecified: Secondary | ICD-10-CM | POA: Diagnosis not present

## 2016-03-04 DIAGNOSIS — Z791 Long term (current) use of non-steroidal anti-inflammatories (NSAID): Secondary | ICD-10-CM | POA: Diagnosis not present

## 2016-03-04 DIAGNOSIS — Z79899 Other long term (current) drug therapy: Secondary | ICD-10-CM | POA: Diagnosis not present

## 2016-03-04 DIAGNOSIS — F17210 Nicotine dependence, cigarettes, uncomplicated: Secondary | ICD-10-CM | POA: Diagnosis not present

## 2016-03-04 DIAGNOSIS — I10 Essential (primary) hypertension: Secondary | ICD-10-CM | POA: Diagnosis not present

## 2016-03-04 DIAGNOSIS — R079 Chest pain, unspecified: Secondary | ICD-10-CM

## 2016-03-04 DIAGNOSIS — R109 Unspecified abdominal pain: Secondary | ICD-10-CM | POA: Diagnosis not present

## 2016-03-04 DIAGNOSIS — Z7982 Long term (current) use of aspirin: Secondary | ICD-10-CM | POA: Diagnosis not present

## 2016-03-04 DIAGNOSIS — R51 Headache: Secondary | ICD-10-CM | POA: Diagnosis not present

## 2016-03-04 DIAGNOSIS — H6692 Otitis media, unspecified, left ear: Secondary | ICD-10-CM | POA: Diagnosis not present

## 2016-03-04 DIAGNOSIS — R072 Precordial pain: Secondary | ICD-10-CM | POA: Diagnosis present

## 2016-03-04 LAB — BASIC METABOLIC PANEL
Anion gap: 11 (ref 5–15)
BUN: 17 mg/dL (ref 6–20)
CALCIUM: 8.8 mg/dL — AB (ref 8.9–10.3)
CO2: 25 mmol/L (ref 22–32)
CREATININE: 0.94 mg/dL (ref 0.61–1.24)
Chloride: 105 mmol/L (ref 101–111)
GFR calc non Af Amer: >60 mL/min (ref >60)
Glucose, Bld: 106 mg/dL — ABNORMAL HIGH (ref 65–99)
Potassium: 3.7 mmol/L (ref 3.5–5.1)
SODIUM: 141 mmol/L (ref 135–145)

## 2016-03-04 LAB — CBC
HCT: 43.8 % (ref 39.0–52.0)
Hemoglobin: 14.3 g/dL (ref 13.0–17.0)
MCH: 29.5 pg (ref 26.0–34.0)
MCHC: 32.6 g/dL (ref 30.0–36.0)
MCV: 90.5 fL (ref 78.0–100.0)
PLATELETS: 244 10*3/uL (ref 150–400)
RBC: 4.84 MIL/uL (ref 4.22–5.81)
RDW: 12.9 % (ref 11.5–15.5)
WBC: 9.4 10*3/uL (ref 4.0–10.5)

## 2016-03-04 LAB — TROPONIN I

## 2016-03-04 MED ORDER — NAPROXEN 500 MG PO TABS: 500.0000 mg | ORAL_TABLET | Freq: Two times a day (BID) | ORAL | Status: DC

## 2016-03-04 MED ORDER — AZITHROMYCIN 250 MG PO TABS: 250.0000 mg | ORAL_TABLET | Freq: Every day | ORAL | Status: DC

## 2016-03-04 NOTE — ED Provider Notes (Signed)
CSN: [id number]     Arrival date & time 03/04/16  1003 History  By signing my name below, I, [name], attest that this documentation has been prepared under the direction and in the presence of [name], MD. Electronically Signed: [name], ED Scribe. 03/04/2016. 11:21 AM.   Chief Complaint  Patient presents with  . Chest Pain   Patient is a 35 y.o. male presenting with chest pain. The history is provided by the patient. No language interpreter was used.  Chest Pain Pain location:  Substernal area and L chest Pain quality: aching   Pain radiates to:  Does not radiate Pain radiates to the back: no   Pain severity:  Mild Duration:  7 hours Timing:  Constant Chronicity:  New Context: at rest   Relieved by:  None tried Worsened by:  Nothing tried Ineffective treatments:  None tried Associated symptoms: abdominal pain, diaphoresis and headache   Associated symptoms: no back pain, no cough, no fever, no nausea and not vomiting     HPI Comments: Marco Martinez is a 35 y.o. male with a history of GERD, chronic chest pain, and essential HTN, who presents to the Emergency Department complaining of new, constant, 3/10 left substernal chest pain that began that began this morning while waking up from sleep. He also notes left ear popping and pain radiating to his jaw that began last night about 6:30 PM, resolved for an hour, and returned. His relative notes left-sided facial swelling, appearing as a "knot" on his left face. Other symptoms include diaphoresis yesterday, left abdominal pain that began 1 week ago, and headache. Patient states he has had chronic chest pain for several years due to GERD/hiatal hernia, but he states his chest pain today felt different than his typical pain. Per medical records, patient had a negative cath on June 2015. He denies nausea, vomiting, SOB, dental pain, fevers, chills, visual changes, cough, rhinorrhea, sore throat, diarrhea, hematochezia, melena,  hematuria, dysuria, leg swelling, bleeding easily, back pain, or rash. Patient has NKDA to antibiotics.   Past Medical History  Diagnosis Date  . GERD (gastroesophageal reflux disease)   . Anxiety   . Depression   . Paresthesias   . Chronic abdominal pain   . Chronic chest pain   . Headache   . Essential hypertension, benign    Past Surgical History  Procedure Laterality Date  . Esophagogastroduodenoscopy N/A 12/06/2013    Procedure: ESOPHAGOGASTRODUODENOSCOPY (EGD);  Surgeon: Malissa Hippo, MD;  Location: [facility];  Service: Endoscopy;  Laterality: N/A;  240-moved to 1240 Ann to notify pt  . Left heart catheterization with coronary angiogram N/A 04/02/2014    Procedure: LEFT HEART CATHETERIZATION WITH CORONARY ANGIOGRAM;  Surgeon: Micheline Chapman, MD;  Location: [facility] CATH LAB;  Service: Cardiovascular;  Laterality: N/A;   Family History  Problem Relation Age of Onset  . Bladder Cancer Father   . Hypertension Mother    Social History  Substance Use Topics  . Smoking status: Current Every Day Smoker -- 1.50 packs/day for 20 years    Types: Cigarettes  . Smokeless tobacco: Never Used     Comment: 1 1/2 pack a day   . Alcohol Use: No    Review of Systems  Constitutional: Positive for diaphoresis. Negative for fever and chills.  HENT: Positive for ear pain and facial swelling. Negative for dental problem, rhinorrhea and sore throat.   Eyes: Negative for visual disturbance.  Respiratory: Negative for cough.  Cardiovascular: Positive for chest pain. Negative for leg swelling.  Gastrointestinal: Positive for abdominal pain. Negative for nausea, vomiting, diarrhea and blood in stool.  Genitourinary: Negative for dysuria and hematuria.  Musculoskeletal: Negative for back pain.  Skin: Negative for rash.  Neurological: Positive for headaches.  Hematological: Does not bruise/bleed easily.  All other systems reviewed and are negative.     Allergies  Review of patient's  allergies indicates no known allergies.  Home Medications   Prior to Admission medications   Medication Sig Start Date End Date Taking? Authorizing Provider  acetaminophen (TYLENOL) 500 MG tablet Take 1,000 mg by mouth every 4 (four) hours as needed for moderate pain or headache.    Yes Historical Provider, MD  Aspirin-Acetaminophen (GOODYS BODY PAIN PO) Take 1 Package by mouth 3 (three) times daily as needed (pain).   Yes Historical Provider, MD  ibuprofen (ADVIL,MOTRIN) 400 MG tablet Take 400 mg by mouth every 6 (six) hours as needed for moderate pain.   Yes Historical Provider, MD  azithromycin (ZITHROMAX) 250 MG tablet Take 1 tablet (250 mg total) by mouth daily. Take first 2 tablets together, then 1 every day until finished. 03/04/16   [name], MD  calcium carbonate (TUMS - DOSED IN MG ELEMENTAL CALCIUM) 500 MG chewable tablet Chew 1 tablet by mouth 2 (two) times daily as needed for indigestion or heartburn. Reported on 03/04/2016    Historical Provider, MD  famotidine (PEPCID) 20 MG tablet Take 1 tablet (20 mg total) by mouth 2 (two) times daily. Patient not taking: Reported on 03/04/2016 12/27/14   Donnetta HutchingBrian Cook, MD  hydrocortisone (ANUSOL-HC) 25 MG suppository Place 1 suppository (25 mg total) rectally 2 (two) times daily. Patient not taking: Reported on 03/04/2016 12/27/14   Donnetta HutchingBrian Cook, MD  Menthol, Topical Analgesic, (ICY HOT EX) Apply 1 application topically 2 (two) times daily as needed (for pain). Reported on 03/04/2016    Historical Provider, MD  naproxen (NAPROSYN) 500 MG tablet Take 1 tablet (500 mg total) by mouth 2 (two) times daily. 03/04/16   [name], MD  promethazine (PHENERGAN) 25 MG tablet Take 1 tablet (25 mg total) by mouth every 6 (six) hours as needed. Patient not taking: Reported on 03/04/2016 12/27/14   Donnetta HutchingBrian Cook, MD   BP 131/93 mmHg  Pulse 76  Resp 22  Ht 6\' 1"  (1.854 m)  Wt 102.059 kg  BMI 29.69 kg/m2  SpO2 100% Physical Exam  Constitutional: He is oriented  to person, place, and time. He appears well-developed and well-nourished. No distress.  HENT:  Head: Normocephalic and atraumatic.  Mouth/Throat: Oropharynx is clear and moist.  Mucous membranes are moist.   Eyes: Conjunctivae and EOM are normal. Pupils are equal, round, and reactive to light. No scleral icterus.  Pupils are normal. Sclera is clear. Eyes track normal.    Neck: Neck supple. No tracheal deviation present.  Cardiovascular: Normal rate, regular rhythm and normal heart sounds.   Pulmonary/Chest: Effort normal and breath sounds normal. No respiratory distress. He has no wheezes. He has no rales.  Lungs are clear to auscultation. Room air sats at 100%.   Abdominal: Soft. Bowel sounds are normal. There is no tenderness.  Musculoskeletal: Normal range of motion. He exhibits no edema.  No ankle swelling.  Lymphadenopathy:  1 x 1 cm palpable lymph node, mildly tender, just inferior at the angle of the jaw.   Neurological: He is alert and oriented to person, place, and time.  Skin: Skin is warm and  dry.  Psychiatric: He has a normal mood and affect. His behavior is normal.  Nursing note and vitals reviewed.   ED Course  Procedures (including critical care time)  DIAGNOSTIC STUDIES: Oxygen Saturation is 98% on RA, normal by my interpretation.    COORDINATION OF CARE: 11:21 AM - Discussed treatment plan with pt at bedside which includes cardiac work-up. Pt verbalized understanding and agreed to plan.   Labs Review Labs Reviewed  BASIC METABOLIC PANEL - Abnormal; Notable for the following:    Glucose, Bld 106 (*)    Calcium 8.8 (*)    All other components within normal limits  CBC  TROPONIN I   Results for orders placed or performed during the hospital encounter of 03/04/16  Basic metabolic panel  Result Value Ref Range   Sodium 141 135 - 145 mmol/L   Potassium 3.7 3.5 - 5.1 mmol/L   Chloride 105 101 - 111 mmol/L   CO2 25 22 - 32 mmol/L   Glucose, Bld 106 (H) 65 - 99  mg/dL   BUN 17 6 - 20 mg/dL   Creatinine, Ser 9.56 0.61 - 1.24 mg/dL   Calcium 8.8 (L) 8.9 - 10.3 mg/dL   GFR calc non Af Amer >60 >60 mL/min   GFR calc Af Amer >60 >60 mL/min   Anion gap 11 5 - 15  CBC  Result Value Ref Range   WBC 9.4 4.0 - 10.5 K/uL   RBC 4.84 4.22 - 5.81 MIL/uL   Hemoglobin 14.3 13.0 - 17.0 g/dL   HCT 21.3 08.6 - 57.8 %   MCV 90.5 78.0 - 100.0 fL   MCH 29.5 26.0 - 34.0 pg   MCHC 32.6 30.0 - 36.0 g/dL   RDW 46.9 62.9 - 52.8 %   Platelets 244 150 - 400 K/uL  Troponin I  Result Value Ref Range   Troponin I <0.03 <0.031 ng/mL    Imaging Review Dg Chest 2 View  03/04/2016  CLINICAL DATA:  Chest pain for several years EXAM: CHEST  2 VIEW COMPARISON:  03/14/2014 FINDINGS: The heart size and mediastinal contours are within normal limits. Both lungs are clear. The visualized skeletal structures are unremarkable. IMPRESSION: No active cardiopulmonary disease. Electronically Signed   By: Alcide Clever M.D.   On: 03/04/2016 11:04   I have personally reviewed and evaluated these images and lab results as part of my medical decision-making.   EKG Interpretation   Date/Time:  Thursday Mar 04 2016 10:11:02 EDT Ventricular Rate:  77 PR Interval:  142 QRS Duration: 97 QT Interval:  366 QTC Calculation: 414 R Axis:   78 Text Interpretation:  Sinus rhythm Confirmed by Charnel Giles  MD, Milca Sytsma  ([id number]) on 03/04/2016 10:49:01 AM      MDM   Final diagnoses:  Acute left otitis media, recurrence not specified, unspecified otitis media type  Chest pain, unspecified chest pain type    Patient with complaint of chest pain left anterior chest. Patient with a normal cardiac cath in 2015. Chest pain workup here no acute John changes on the EKG troponin was negative. With a normal cath highly unlikely to have significant coronary disease at this point in time. Patient is stable for discharge home regarding that. In addition patient with complaint of left-sided jaw pain more at the  angle of the jaw a reactive lymph node at the base of the angle of the jaw and below the pinna. Patient's left eardrum with evidence of infection. Patient's also been having popping  and crackling of that ear. Will treat with Zithromax and Naprosyn. Work note provided for patient to be out of work for the next 2 days.    I personally performed the services described in this documentation, which was scribed in my presence. The recorded information has been reviewed and is accurate.       [name], MD 03/04/16 1304

## 2016-03-04 NOTE — ED Notes (Addendum)
Pt reports chest pain for last several years but reports pain is different today. Pt reports left ear pain and left facial swelling since last night. nad noted.

## 2016-03-04 NOTE — Discharge Instructions (Signed)
Otitis Media, Adult Otitis media is redness, soreness, and puffiness (swelling) in the space just behind your eardrum (middle ear). It may be caused by allergies or infection. It often happens along with a cold. HOME CARE  Take your medicine as told. Finish it even if you start to feel better.  Only take over-the-counter or prescription medicines for pain, discomfort, or fever as told by your doctor.  Follow up with your doctor as told. GET HELP IF:  You have otitis media only in one ear, or bleeding from your nose, or both.  You notice a lump on your neck.  You are not getting better in 3-5 days.  You feel worse instead of better. GET HELP RIGHT AWAY IF:   You have pain that is not helped with medicine.  You have puffiness, redness, or pain around your ear.  You get a stiff neck.  You cannot move part of your face (paralysis).  You notice that the bone behind your ear hurts when you touch it. MAKE SURE YOU:   Understand these instructions.  Will watch your condition.  Will get help right away if you are not doing well or get worse.   This information is not intended to replace advice given to you by your health care provider. Make sure you discuss any questions you have with your health care provider.   Take the antibiotic Zithromax and as directed. Take the Naprosyn as needed for the pain. Return if not improving in a few days. Follow-up with your doctor if the lymph node does not resolve. Chest pain workup was negative.   Document Released: 03/29/2008 Document Revised: 11/01/2014 Document Reviewed: 05/08/2013 Elsevier Interactive Patient Education [employer]2016 Elsevier Inc.

## 2017-04-12 IMAGING — DX DG CHEST 2V
2 series · 2 of 2 positions shown · non-contrast
Comparison: 03/14/2014

CLINICAL DATA: Chest pain for several years

EXAM:
CHEST  2 VIEW

[chest pa]
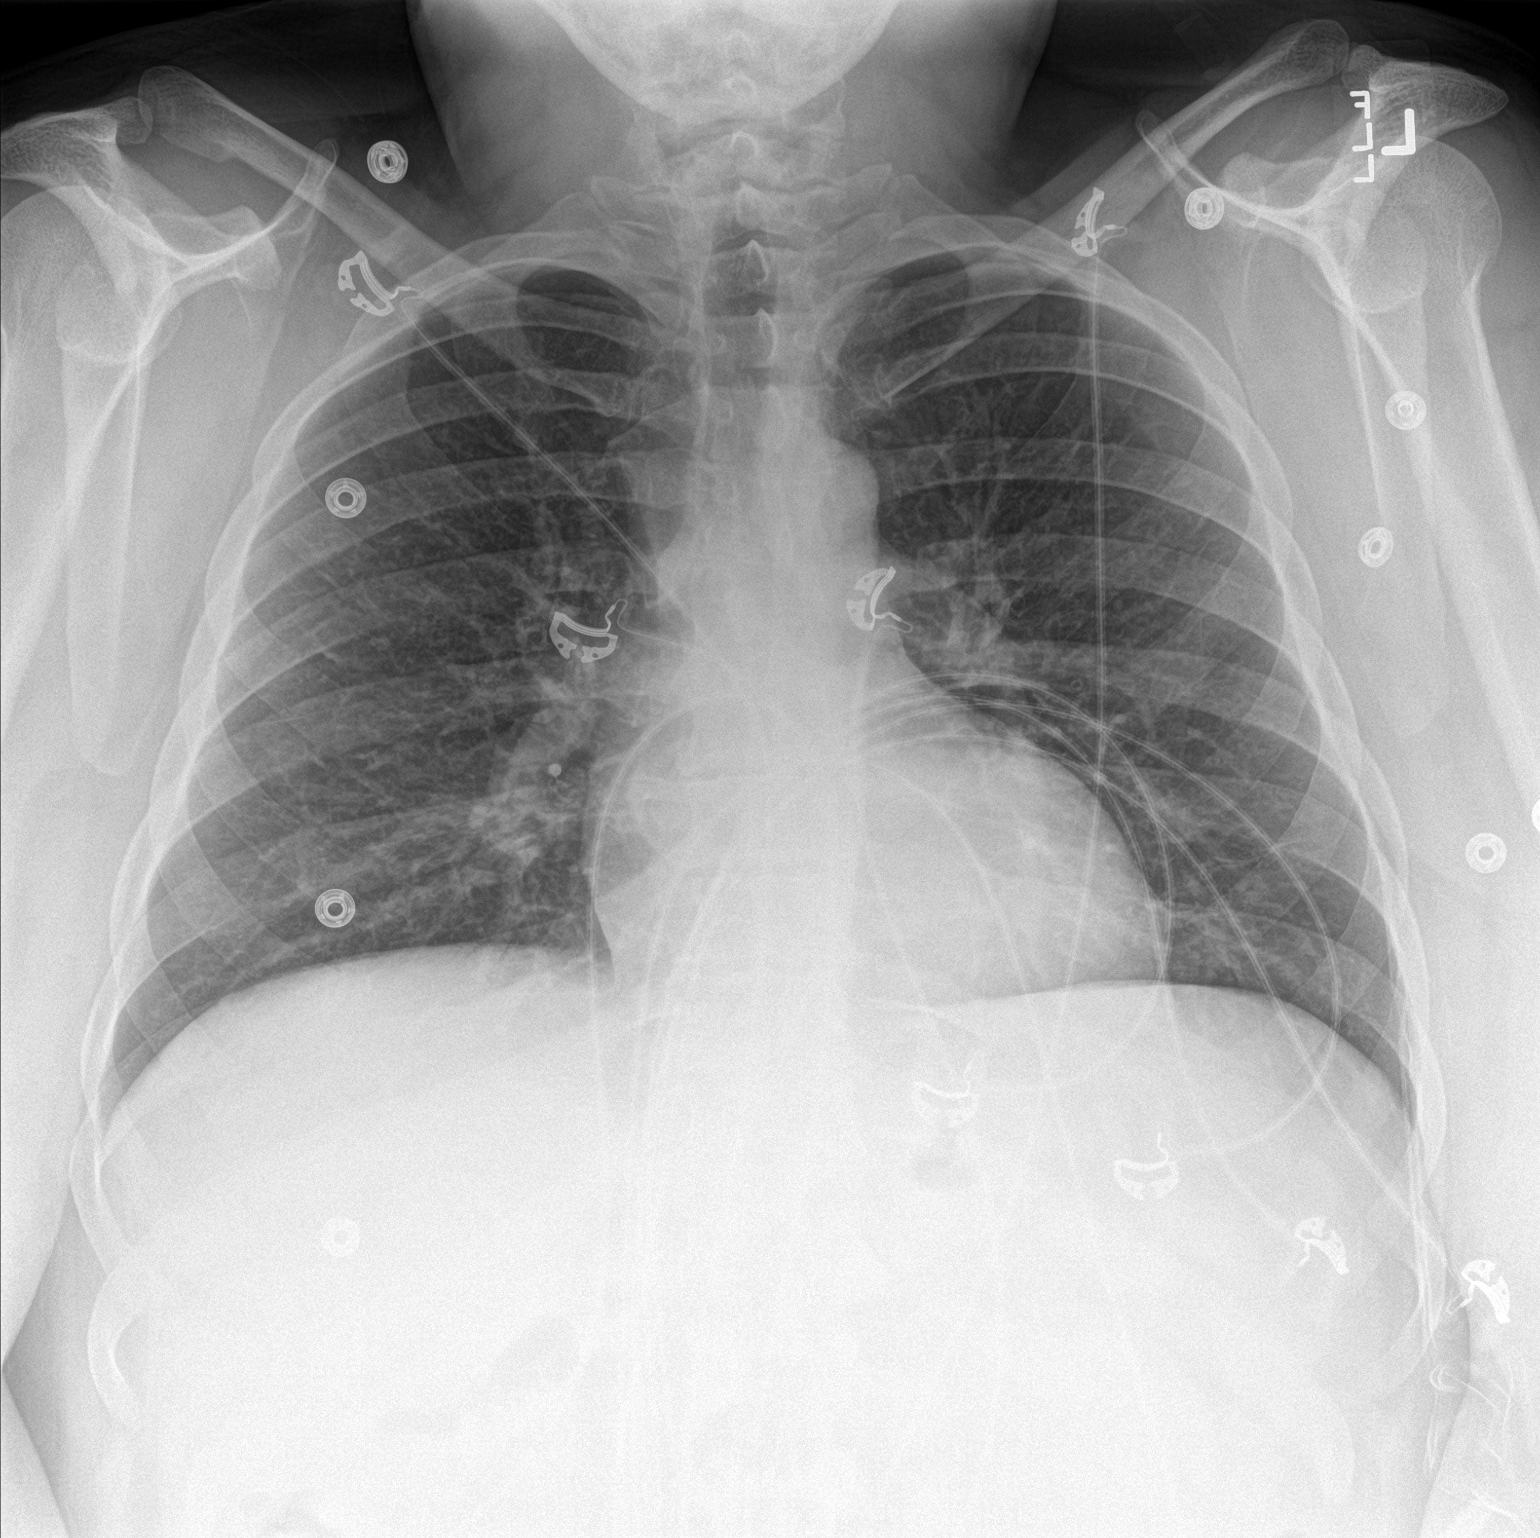

[chest lat]
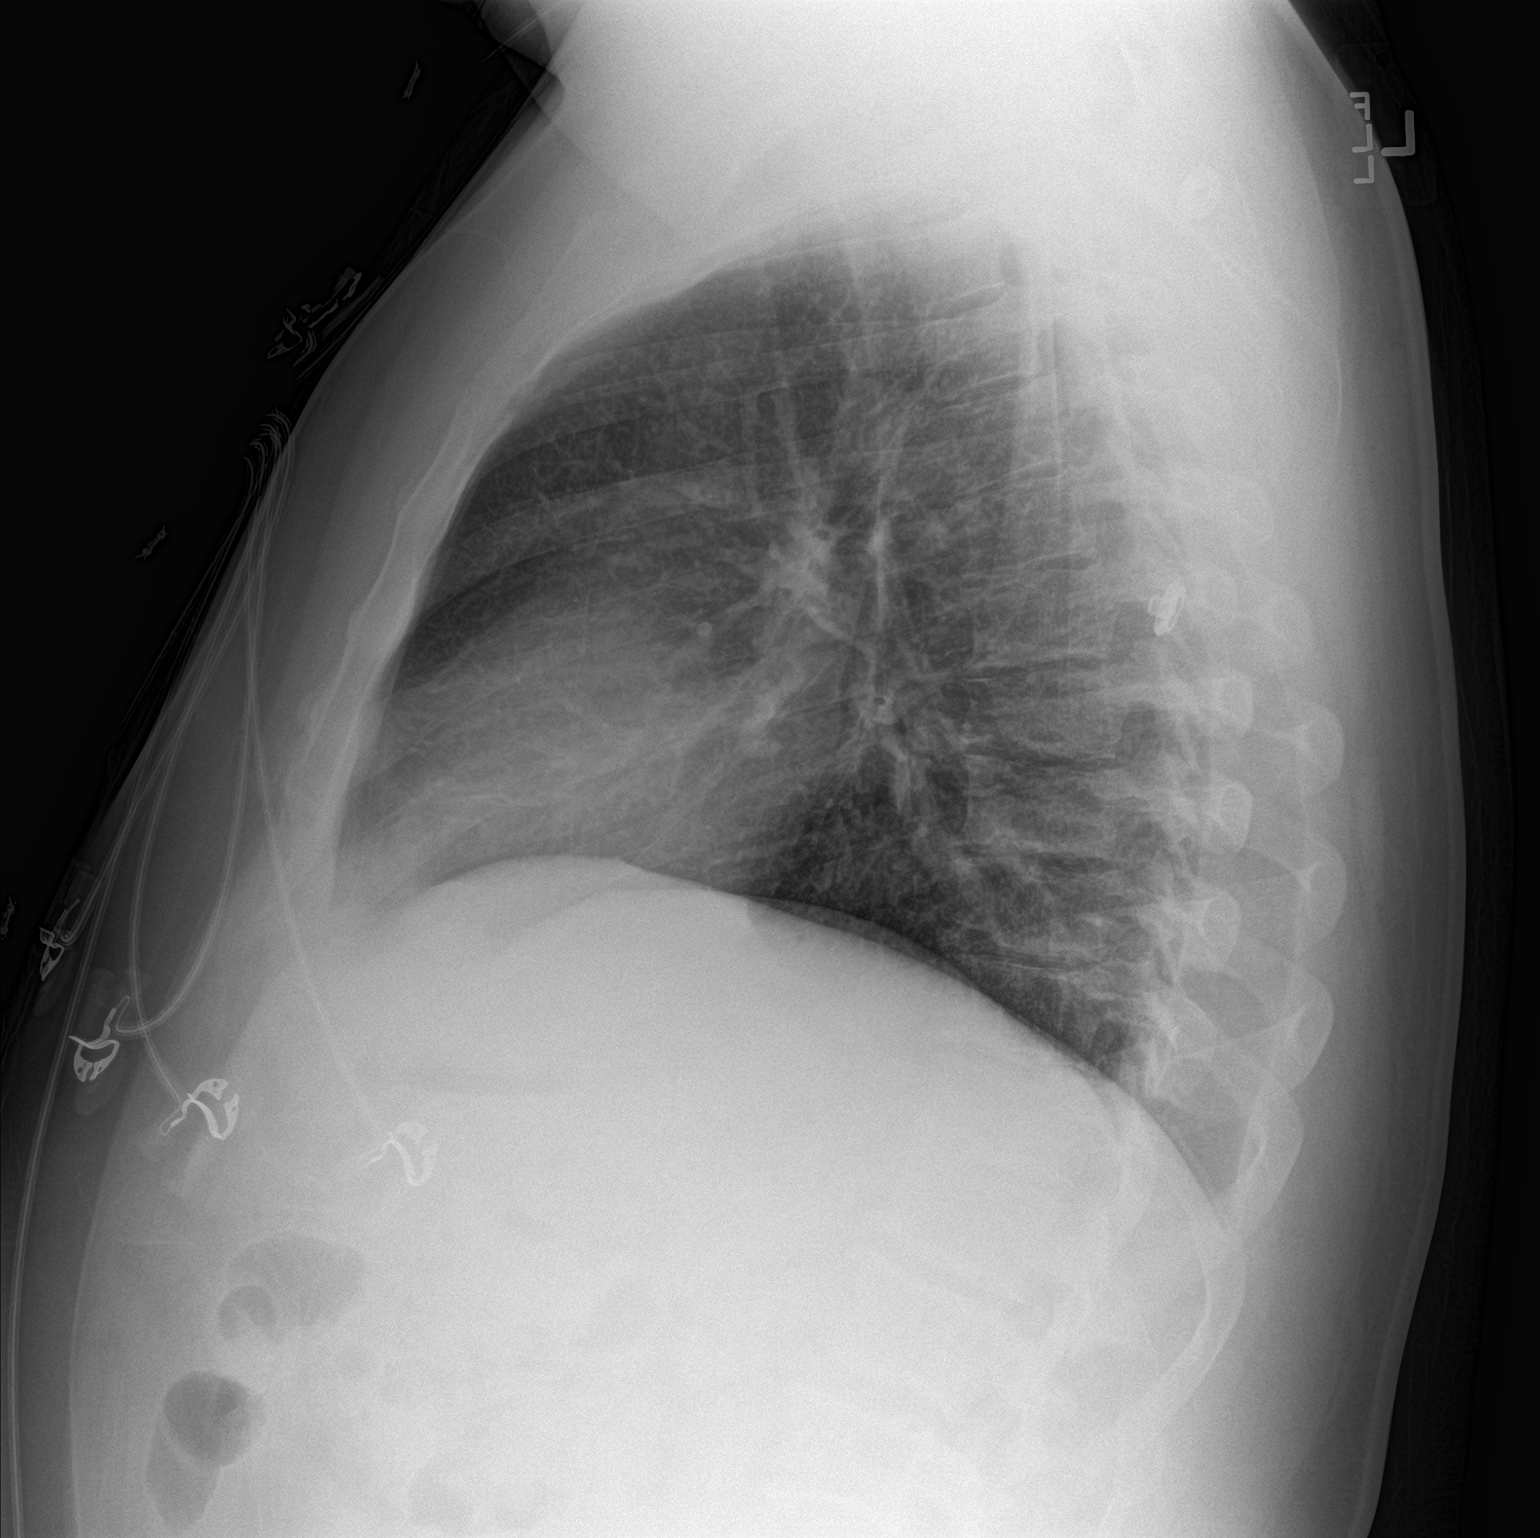

[2 of 2 positions shown; findings below may reference images not displayed]

FINDINGS: The heart size and mediastinal contours are within normal limits.
Both lungs are clear. The visualized skeletal structures are
unremarkable.
IMPRESSION: No active cardiopulmonary disease.

## 2020-09-25 ENCOUNTER — Other Ambulatory Visit: Payer: Self-pay

## 2020-11-04 ENCOUNTER — Other Ambulatory Visit: Payer: Self-pay

## 2020-11-04 ENCOUNTER — Ambulatory Visit (INDEPENDENT_AMBULATORY_CARE_PROVIDER_SITE_OTHER): Payer: No Typology Code available for payment source | Admitting: Urology

## 2020-11-04 ENCOUNTER — Encounter: Payer: Self-pay | Admitting: Urology

## 2020-11-04 VITALS — BP 167/108 | HR 81 | Temp 98.3°F | Ht 73.0 in | Wt 285.0 lb

## 2020-11-04 DIAGNOSIS — R7989 Other specified abnormal findings of blood chemistry: Secondary | ICD-10-CM | POA: Diagnosis not present

## 2020-11-04 LAB — URINALYSIS, ROUTINE W REFLEX MICROSCOPIC
Bilirubin, UA: NEGATIVE
Glucose, UA: NEGATIVE
Ketones, UA: NEGATIVE
Leukocytes,UA: NEGATIVE
Nitrite, UA: NEGATIVE
RBC, UA: NEGATIVE
Specific Gravity, UA: >1.03 — ABNORMAL HIGH (ref 1.005–1.030)
Urobilinogen, Ur: 0.2 mg/dL (ref 0.2–1.0)
pH, UA: 6 (ref 5.0–7.5)

## 2020-11-04 LAB — MICROSCOPIC EXAMINATION
Bacteria, UA: NONE SEEN
Epithelial Cells (non renal): NONE SEEN /hpf (ref 0–10)
RBC: NONE SEEN /hpf (ref 0–2)
Renal Epithel, UA: NONE SEEN /hpf
WBC, UA: NONE SEEN /hpf (ref 0–5)

## 2020-11-04 NOTE — Progress Notes (Signed)
Urological Symptom Review  Patient is experiencing the following symptoms: None    Review of Systems  Gastrointestinal (upper)  : Negative for upper GI symptoms  Gastrointestinal (lower) : Negative for lower GI symptoms  Constitutional : Negative for symptoms  Skin: Negative for skin symptoms  Eyes: Negative for eye symptoms  Ear/Nose/Throat : Negative for Ear/Nose/Throat symptoms  Hematologic/Lymphatic: Negative for Hematologic/Lymphatic symptoms  Cardiovascular : Negative for cardiovascular symptoms  Respiratory : Negative for respiratory symptoms  Endocrine: Negative for endocrine symptoms  Musculoskeletal: Joint pain  Neurological: Negative for neurological symptoms  Psychologic: Negative for psychiatric symptoms  

## 2020-11-04 NOTE — Progress Notes (Signed)
H&P  Chief Complaint: Low T  History of Present Illness: Pt reports feeling easily fatigued and has had difficulty concentrating during the past year. He was seen by his PCP and his T levels were drawn in response. Testosterone - 210 (am drawn on 09/04/2020).  He denies any ED but does note relatively reduced libido.   Past Medical History:  Diagnosis Date  . Anxiety   . Chronic abdominal pain   . Chronic chest pain   . Depression   . Essential hypertension, benign   . GERD (gastroesophageal reflux disease)   . Headache   . Kidney stone   . Paresthesias     Past Surgical History:  Procedure Laterality Date  . ESOPHAGOGASTRODUODENOSCOPY N/A 12/06/2013   Procedure: ESOPHAGOGASTRODUODENOSCOPY (EGD);  Surgeon: Malissa Hippo, MD;  Location: [facility];  Service: Endoscopy;  Laterality: N/A;  240-moved to 1240 Ann to notify pt  . LEFT HEART CATHETERIZATION WITH CORONARY ANGIOGRAM N/A 04/02/2014   Procedure: LEFT HEART CATHETERIZATION WITH CORONARY ANGIOGRAM;  Surgeon: Micheline Chapman, MD;  Location: [facility] CATH LAB;  Service: Cardiovascular;  Laterality: N/A;    Home Medications:  Allergies as of 11/04/2020   No Known Allergies     Medication List       Accurate as of November 04, 2020  9:26 AM. If you have any questions, ask your nurse or doctor.        acetaminophen 500 MG tablet Commonly known as: TYLENOL Take 1,000 mg by mouth every 4 (four) hours as needed for moderate pain or headache.   atorvastatin 10 MG tablet Commonly known as: LIPITOR SMARTSIG:1 Tablet(s) By Mouth Every Evening   azithromycin 250 MG tablet Commonly known as: ZITHROMAX Take 1 tablet (250 mg total) by mouth daily. Take first 2 tablets together, then 1 every day until finished.   calcium carbonate 500 MG chewable tablet Commonly known as: TUMS - dosed in mg elemental calcium Chew 1 tablet by mouth 2 (two) times daily as needed for indigestion or heartburn. Reported on 03/04/2016   famotidine 20  MG tablet Commonly known as: PEPCID Take 1 tablet (20 mg total) by mouth 2 (two) times daily.   GOODYS BODY PAIN PO Take 1 Package by mouth 3 (three) times daily as needed (pain).   hydrocortisone 25 MG suppository Commonly known as: ANUSOL-HC Place 1 suppository (25 mg total) rectally 2 (two) times daily.   ibuprofen 400 MG tablet Commonly known as: ADVIL Take 400 mg by mouth every 6 (six) hours as needed for moderate pain.   ICY HOT EX Apply 1 application topically 2 (two) times daily as needed (for pain). Reported on 03/04/2016   naproxen 500 MG tablet Commonly known as: NAPROSYN Take 1 tablet (500 mg total) by mouth 2 (two) times daily.   omeprazole 10 MG capsule Commonly known as: PRILOSEC Take 10 mg by mouth daily.   promethazine 25 MG tablet Commonly known as: PHENERGAN Take 1 tablet (25 mg total) by mouth every 6 (six) hours as needed.       Allergies: No Known Allergies  Family History  Problem Relation Age of Onset  . Bladder Cancer Father   . Hypertension Mother     Social History:  reports that he quit smoking about 5 years ago. His smoking use included cigarettes. He has a 30.00 pack-year smoking history. He has never used smokeless tobacco. He reports that he does not drink alcohol and does not use drugs.  ROS: A complete review  of systems was performed.  All systems are negative except for pertinent findings as noted.  Physical Exam:  Vital signs in last 24 hours: There were no vitals taken for this visit. Constitutional:  Alert and oriented, No acute distress Cardiovascular: Regular rate  Respiratory: Normal respiratory effort GI: Abdomen is soft, nontender, nondistended, no abdominal masses. No CVAT. No hernias. Obese. Genitourinary: Normal male phallus, testes are descended bilaterally and non-tender and without masses, scrotum is normal in appearance without lesions or masses, perineum is normal on inspection. Several small condylomata @  penoscrotal junction. Neurologic: Grossly intact, no focal deficits Psychiatric: Normal mood and affect  I have reviewed prior pt notes  I have reviewed notes from referring/previous physicians  I have reviewed urinalysis results  Recent labs reviewed  Impression/Assessment:  Hypogonadism - Pt testosterone is slightly low, may be secondary to obesity. Pt lab results pending for today and will f/u accordingly  Plan:  1. Pt advised regarding a heart-healthy diet, exercise, and weight-loss.  2. Pt advised regarding Testosterone supplementation (clomid, cypionate injection)  3. F/U depending on lab results.  CC: Wayland Denis, [place]

## 2020-11-05 ENCOUNTER — Other Ambulatory Visit: Payer: Self-pay | Admitting: Urology

## 2020-11-05 DIAGNOSIS — R7989 Other specified abnormal findings of blood chemistry: Secondary | ICD-10-CM

## 2020-11-05 LAB — TESTOSTERONE: Testosterone: 134 ng/dL — ABNORMAL LOW (ref 264–916)

## 2020-11-05 MED ORDER — CLOMIPHENE CITRATE 50 MG PO TABS: 50.0000 mg | ORAL_TABLET | Freq: Every day | ORAL | 11 refills | Status: AC

## 2020-11-06 ENCOUNTER — Telehealth: Payer: Self-pay

## 2020-11-06 NOTE — Telephone Encounter (Signed)
-----   Message from Marcine Matar, MD sent at 11/05/2020  8:51 PM EST ----- Please call pt--T level lower @ 134. I will start treatment w/ clomiphene as discussed in office  and sent in scrip to [facility]. I want to see him back in 3 mos following repeat labs--I put order in for labs ----- Message ----- From: Ferdinand Lango, RN Sent: 11/05/2020   8:39 AM EST To: Marcine Matar, MD  Please review

## 2020-11-06 NOTE — Telephone Encounter (Signed)
Pt called and made aware. Appt made for OV and lab.

## 2021-02-04 ENCOUNTER — Other Ambulatory Visit: Payer: PRIVATE HEALTH INSURANCE

## 2021-02-10 ENCOUNTER — Ambulatory Visit: Payer: PRIVATE HEALTH INSURANCE | Admitting: Urology

## 2021-03-03 ENCOUNTER — Other Ambulatory Visit: Payer: Self-pay

## 2021-03-03 ENCOUNTER — Other Ambulatory Visit: Payer: PRIVATE HEALTH INSURANCE

## 2021-03-03 DIAGNOSIS — R7989 Other specified abnormal findings of blood chemistry: Secondary | ICD-10-CM

## 2021-03-04 LAB — TESTOSTERONE,FREE AND TOTAL
Testosterone, Free: 13.7 pg/mL (ref 6.8–21.5)
Testosterone: 420 ng/dL (ref 264–916)

## 2021-03-06 NOTE — Progress Notes (Signed)
Results mailed. Attempted to call- no answer.

## 2021-03-10 ENCOUNTER — Ambulatory Visit (INDEPENDENT_AMBULATORY_CARE_PROVIDER_SITE_OTHER): Payer: No Typology Code available for payment source | Admitting: Urology

## 2021-03-10 ENCOUNTER — Encounter: Payer: Self-pay | Admitting: Urology

## 2021-03-10 ENCOUNTER — Other Ambulatory Visit: Payer: Self-pay

## 2021-03-10 VITALS — BP 152/95 | HR 84

## 2021-03-10 DIAGNOSIS — R7989 Other specified abnormal findings of blood chemistry: Secondary | ICD-10-CM | POA: Diagnosis not present

## 2021-03-10 LAB — URINALYSIS, ROUTINE W REFLEX MICROSCOPIC
Bilirubin, UA: NEGATIVE
Glucose, UA: NEGATIVE
Ketones, UA: NEGATIVE
Leukocytes,UA: NEGATIVE
Nitrite, UA: NEGATIVE
Protein,UA: NEGATIVE
RBC, UA: NEGATIVE
Specific Gravity, UA: 1.025 (ref 1.005–1.030)
Urobilinogen, Ur: 2 mg/dL — ABNORMAL HIGH (ref 0.2–1.0)
pH, UA: 6 (ref 5.0–7.5)

## 2021-03-10 NOTE — Progress Notes (Signed)
History of Present Illness: Here for follow-up of low testosterone.  He was put on clomiphene citrate several months ago for low testosterone.  He is on 50 mg a day.  It is sometimes hard to get in over priced at his local pharmacy.  He does feel better on this medication.  Recent total testosterone was 420, free testosterone 13.7, both normal.  Past Medical History:  Diagnosis Date  . Anxiety   . Chronic abdominal pain   . Chronic chest pain   . Depression   . Essential hypertension, benign   . GERD (gastroesophageal reflux disease)   . Headache   . Kidney stone   . Paresthesias     Past Surgical History:  Procedure Laterality Date  . ESOPHAGOGASTRODUODENOSCOPY N/A 12/06/2013   Procedure: ESOPHAGOGASTRODUODENOSCOPY (EGD);  Surgeon: Malissa Hippo, MD;  Location: [facility];  Service: Endoscopy;  Laterality: N/A;  240-moved to 1240 Ann to notify pt  . LEFT HEART CATHETERIZATION WITH CORONARY ANGIOGRAM N/A 04/02/2014   Procedure: LEFT HEART CATHETERIZATION WITH CORONARY ANGIOGRAM;  Surgeon: Micheline Chapman, MD;  Location: [facility] CATH LAB;  Service: Cardiovascular;  Laterality: N/A;    Home Medications:  Allergies as of 03/10/2021   No Known Allergies     Medication List       Accurate as of Mar 10, 2021  8:02 AM. If you have any questions, ask your nurse or doctor.        acetaminophen 500 MG tablet Commonly known as: TYLENOL Take 1,000 mg by mouth every 4 (four) hours as needed for moderate pain or headache.   atorvastatin 10 MG tablet Commonly known as: LIPITOR SMARTSIG:1 Tablet(s) By Mouth Every Evening   azithromycin 250 MG tablet Commonly known as: ZITHROMAX Take 1 tablet (250 mg total) by mouth daily. Take first 2 tablets together, then 1 every day until finished.   calcium carbonate 500 MG chewable tablet Commonly known as: TUMS - dosed in mg elemental calcium Chew 1 tablet by mouth 2 (two) times daily as needed for indigestion or heartburn. Reported on 03/04/2016    clomiPHENE 50 MG tablet Commonly known as: CLOMID Take 1 tablet (50 mg total) by mouth daily.   famotidine 20 MG tablet Commonly known as: PEPCID Take 1 tablet (20 mg total) by mouth 2 (two) times daily.   GOODYS BODY PAIN PO Take 1 Package by mouth 3 (three) times daily as needed (pain).   ibuprofen 400 MG tablet Commonly known as: ADVIL Take 400 mg by mouth every 6 (six) hours as needed for moderate pain.   ICY HOT EX Apply 1 application topically 2 (two) times daily as needed (for pain). Reported on 03/04/2016   naproxen 500 MG tablet Commonly known as: NAPROSYN Take 1 tablet (500 mg total) by mouth 2 (two) times daily.   omeprazole 10 MG capsule Commonly known as: PRILOSEC Take 10 mg by mouth daily.   promethazine 25 MG tablet Commonly known as: PHENERGAN Take 1 tablet (25 mg total) by mouth every 6 (six) hours as needed.       Allergies: No Known Allergies  Family History  Problem Relation Age of Onset  . Bladder Cancer Father   . Hypertension Mother     Social History:  reports that he quit smoking about 5 years ago. His smoking use included cigarettes. He has a 30.00 pack-year smoking history. He has never used smokeless tobacco. He reports that he does not drink alcohol and does not use drugs.  ROS: A complete review of systems was performed.  All systems are negative except for pertinent findings as noted.  Physical Exam:  Vital signs in last 24 hours: There were no vitals taken for this visit. Constitutional:  Alert and oriented, No acute distress Cardiovascular: Regular rate  Respiratory: Normal respiratory effort Neurologic: Grossly intact, no focal deficits Psychiatric: Normal mood and affect  I have reviewed prior pt notes  I have reviewed urinalysis results  I have reviewed prior lab results-- testosterone levels    Impression/Assessment:  Low testosterone, on clomiphene with acceptable results  Plan:  1.  He will continue same  dose  2.  I will see back in 1 year following laboratories

## 2021-03-10 NOTE — Progress Notes (Signed)
Urological Symptom Review  Patient is experiencing the following symptoms: Frequent urination   Review of Systems  Gastrointestinal (upper)  : Negative for upper GI symptoms  Gastrointestinal (lower) : Negative for lower GI symptoms  Constitutional : Negative for symptoms  Skin: Negative for skin symptoms  Eyes: Negative for eye symptoms  Ear/Nose/Throat : Negative for Ear/Nose/Throat symptoms  Hematologic/Lymphatic: Negative for Hematologic/Lymphatic symptoms  Cardiovascular : Negative for cardiovascular symptoms  Respiratory : Negative for respiratory symptoms  Endocrine: Negative for endocrine symptoms  Musculoskeletal: Back pain Joint pain  Neurological: Negative for neurological symptoms  Psychologic: Negative for psychiatric symptoms

## 2022-02-28 ENCOUNTER — Emergency Department (HOSPITAL_COMMUNITY): Payer: No Typology Code available for payment source

## 2022-02-28 ENCOUNTER — Emergency Department (HOSPITAL_COMMUNITY)
Admission: EM | Admit: 2022-02-28 | Discharge: 2022-02-28 | Disposition: A | Discharge status: Home / Self Care | Payer: No Typology Code available for payment source | Attending: Emergency Medicine | Admitting: Emergency Medicine

## 2022-02-28 ENCOUNTER — Other Ambulatory Visit: Payer: Self-pay

## 2022-02-28 ENCOUNTER — Encounter (HOSPITAL_COMMUNITY): Payer: Self-pay

## 2022-02-28 DIAGNOSIS — Z79899 Other long term (current) drug therapy: Secondary | ICD-10-CM | POA: Insufficient documentation

## 2022-02-28 DIAGNOSIS — Z87891 Personal history of nicotine dependence: Secondary | ICD-10-CM | POA: Diagnosis not present

## 2022-02-28 DIAGNOSIS — R079 Chest pain, unspecified: Secondary | ICD-10-CM

## 2022-02-28 DIAGNOSIS — I2510 Atherosclerotic heart disease of native coronary artery without angina pectoris: Secondary | ICD-10-CM | POA: Diagnosis not present

## 2022-02-28 DIAGNOSIS — I10 Essential (primary) hypertension: Secondary | ICD-10-CM | POA: Diagnosis not present

## 2022-02-28 LAB — CBC
HCT: 42.2 % (ref 39.0–52.0)
Hemoglobin: 13.8 g/dL (ref 13.0–17.0)
MCH: 28 pg (ref 26.0–34.0)
MCHC: 32.7 g/dL (ref 30.0–36.0)
MCV: 85.8 fL (ref 80.0–100.0)
Platelets: 278 10*3/uL (ref 150–400)
RBC: 4.92 MIL/uL (ref 4.22–5.81)
RDW: 12.8 % (ref 11.5–15.5)
WBC: 9.8 10*3/uL (ref 4.0–10.5)
nRBC: 0 % (ref 0.0–0.2)

## 2022-02-28 LAB — BASIC METABOLIC PANEL
Anion gap: 8 (ref 5–15)
BUN: 17 mg/dL (ref 6–20)
CO2: 27 mmol/L (ref 22–32)
Calcium: 8.9 mg/dL (ref 8.9–10.3)
Chloride: 106 mmol/L (ref 98–111)
Creatinine, Ser: 0.98 mg/dL (ref 0.61–1.24)
GFR, Estimated: >60 mL/min (ref >60)
Glucose, Bld: 130 mg/dL — ABNORMAL HIGH (ref 70–99)
Potassium: 3.4 mmol/L — ABNORMAL LOW (ref 3.5–5.1)
Sodium: 141 mmol/L (ref 135–145)

## 2022-02-28 LAB — PROTIME-INR
INR: 1.1 (ref 0.8–1.2)
Prothrombin Time: 13.8 seconds (ref 11.4–15.2)

## 2022-02-28 LAB — APTT: aPTT: 28 seconds (ref 24–36)

## 2022-02-28 LAB — TROPONIN I (HIGH SENSITIVITY): Troponin I (High Sensitivity): 3 ng/L (ref <18)

## 2022-02-28 MED ORDER — MELOXICAM 7.5 MG PO TABS: 7.5000 mg | ORAL_TABLET | Freq: Two times a day (BID) | ORAL | 0 refills | Status: AC | PRN

## 2022-02-28 MED ORDER — ASPIRIN 81 MG PO CHEW
324.0000 mg | CHEWABLE_TABLET | Freq: Once | ORAL | Status: AC
  Administered 2022-02-28: 324 mg via ORAL
  Filled 2022-02-28: qty 4

## 2022-02-28 NOTE — Discharge Instructions (Addendum)
Go back to your doctor in 48 hours if still having pain ? ?ER for severe worsening pain or symptoms ? ?Please make sure that you follow through with your stress test scheduled for later this month ? ?Please take a baby aspirin every day, this is 81 mg per dose, your family doctor can alter this medication after your stress test ?

## 2022-02-28 NOTE — ED Triage Notes (Addendum)
Pov from home. Cc of chest pain that has been going on for 3 weeks . He has been for his pcp for it but today it has been all day. Says that he has left side chest pain that goes down his arm. Has a stress test due at the end of the week. Says he woke up with this pain and it is constant.  ?

## 2022-02-28 NOTE — ED Provider Notes (Signed)
?[facility] ?Provider Note ? ? ?CSN: [id number] ?Arrival date & time: 02/28/22  1924 ? ?  ? ?History ? ?Chief Complaint  ?Patient presents with  ? Chest Pain  ? ? ?BOEN STERBENZ is a 41 y.o. male. ? ? ?Chest Pain ? ? Pt is a 41 y/o male - with no CAD history -but has a history of hypertension and hypercholesterolemia.  He used to smoke cigarettes but when he stopped smoking he had resolution of his chest pain, he was actually catheterized in 2015 secondary to his chest pain and he had a negative coronary artery angiogram.  He states that over the last week he has had some intermittent chest pains but today it has been constant all day long, it started when he woke up this morning present upon awakening, the pain seems to radiate into his shoulder arm and jaw.  He is concerned that it may be his heart.  He is scheduled for a stress test coming up in the next couple weeks but because his pain got worse he decided to come in this evening.  He states that nothing seems to make it better or worse today including position or exertion he did take some Goody powders, he is not sure how many ? ?Home Medications ?Prior to Admission medications   ?Medication Sig Start Date End Date Taking? Authorizing Provider  ?atorvastatin (LIPITOR) 10 MG tablet SMARTSIG:1 Tablet(s) By Mouth Every Evening 09/11/20   [provider]  ?clomiPHENE (CLOMID) 50 MG tablet Take 1 tablet (50 mg total) by mouth daily. 11/05/20   Marcine Matar, MD  ?losartan (COZAAR) 50 MG tablet Take 1 tablet by mouth daily. 03/03/21   [provider]  ?omeprazole (PRILOSEC) 10 MG capsule Take 10 mg by mouth daily.    [provider]  ?   ? ?Allergies    ?Patient has no known allergies.   ? ?Review of Systems   ?Review of Systems  ?Cardiovascular:  Positive for chest pain.  ?All other systems reviewed and are negative. ? ?Physical Exam ?Updated Vital Signs ?BP 134/77   Pulse 72   Temp 98 ?F (36.7 ?C)   Resp 20    Ht 1.854 m (6\' 1" )   Wt 129.3 kg   SpO2 94%   BMI 37.61 kg/m?  ?Physical Exam ?Vitals and nursing note reviewed.  ?Constitutional:   ?   General: He is not in acute distress. ?   Appearance: He is well-developed.  ?HENT:  ?   Head: Normocephalic and atraumatic.  ?   Mouth/Throat:  ?   Pharynx: No oropharyngeal exudate.  ?Eyes:  ?   General: No scleral icterus.    ?   Right eye: No discharge.     ?   Left eye: No discharge.  ?   Conjunctiva/sclera: Conjunctivae normal.  ?   Pupils: Pupils are equal, round, and reactive to light.  ?Neck:  ?   Thyroid: No thyromegaly.  ?   Vascular: No JVD.  ?Cardiovascular:  ?   Rate and Rhythm: Normal rate and regular rhythm.  ?   Heart sounds: Normal heart sounds. No murmur heard. ?  No friction rub. No gallop.  ?Pulmonary:  ?   Effort: Pulmonary effort is normal. No respiratory distress.  ?   Breath sounds: Normal breath sounds. No wheezing or rales.  ?Abdominal:  ?   General: Bowel sounds are normal. There is no distension.  ?   Palpations: Abdomen is soft. There  is no mass.  ?   Tenderness: There is no abdominal tenderness.  ?Musculoskeletal:     ?   General: No tenderness. Normal range of motion.  ?   Cervical back: Normal range of motion and neck supple.  ?   Right lower leg: No edema.  ?   Left lower leg: No edema.  ?Lymphadenopathy:  ?   Cervical: No cervical adenopathy.  ?Skin: ?   General: Skin is warm and dry.  ?   Findings: No erythema or rash.  ?Neurological:  ?   Mental Status: He is alert.  ?   Coordination: Coordination normal.  ?Psychiatric:     ?   Behavior: Behavior normal.  ? ? ?ED Results / Procedures / Treatments   ?Labs ?(all labs ordered are listed, but only abnormal results are displayed) ?Labs Reviewed  ?BASIC METABOLIC PANEL - Abnormal; Notable for the following components:  ?    Result Value  ? Potassium 3.4 (*)   ? Glucose, Bld 130 (*)   ? All other components within normal limits  ?CBC  ?PROTIME-INR  ?APTT  ?TROPONIN I (HIGH SENSITIVITY)   ? ? ?EKG ?EKG Interpretation ? ?Date/Time:  Sunday Feb 28 2022 19:39:40 EDT ?Ventricular Rate:  85 ?PR Interval:  152 ?QRS Duration: 108 ?QT Interval:  352 ?QTC Calculation: 419 ?R Axis:   74 ?Text Interpretation: Sinus rhythm Minimal ST depression, inferior leads since last tracing no significant change Confirmed by Eber Hong ([id number]) on 02/28/2022 7:42:51 PM ? ?Radiology ?DG Chest Portable 1 View ? ?Result Date: 02/28/2022 ?CLINICAL DATA:  Chest pain.  Left-sided pain. EXAM: PORTABLE CHEST 1 VIEW COMPARISON:  03/04/2016 FINDINGS: Upper normal heart size likely accentuated by portable AP technique.The cardiomediastinal contours are normal. The lungs are clear. Pulmonary vasculature is normal. No consolidation, pleural effusion, or pneumothorax. No acute osseous abnormalities are seen. IMPRESSION: No acute chest findings. Electronically Signed   By: Narda Rutherford M.D.   On: 02/28/2022 20:03   ? ?Procedures ?Procedures  ? ? ?Medications Ordered in ED ?Medications  ?aspirin chewable tablet 324 mg (324 mg Oral Given 02/28/22 1952)  ? ? ?ED Course/ Medical Decision Making/ A&P ?  ?                        ?Medical Decision Making ?Amount and/or Complexity of Data Reviewed ?Labs: ordered. ?Radiology: ordered. ? ?Risk ?OTC drugs. ? ? ?This patient presents to the ED for concern of chest pain, this involves an extensive number of treatment options, and is a complaint that carries with it a high risk of complications and morbidity.  The differential diagnosis includes acute coronary syndrome, musculoskeletal pain, gastrointestinal pain, less likely be pulmonary embolism given heart rate less than 90 and oxygenation that is normal ? ? ?Co morbidities that complicate the patient evaluation ? ?Hypertension hypercholesterolemia, history of smoking and family history of coronary disease ? ? ?Additional history obtained: ? ?Additional history obtained from prior medical record including heart catheterization from 2015 ?External  records from outside source obtained and reviewed including heart catheterization was unremarkable for any coronary disease of an obstructive nature ? ? ?Lab Tests: ? ?I Ordered, and personally interpreted labs.  The pertinent results include: CBC metabolic panel and troponin, all relatively unremarkable, the troponin was 3 virtually undetectable ? ? ?Imaging Studies ordered: ? ?I ordered imaging studies including portable chest x-ray ?I independently visualized and interpreted imaging which showed no acute findings ?I agree with the  radiologist interpretation ? ? ?Cardiac Monitoring: / EKG: ? ?The patient was maintained on a cardiac monitor.  I personally viewed and interpreted the cardiac monitored which showed an underlying rhythm of: Normal sinus rhythm with no ectopy ? ? ? ?Problem List / ED Course / Critical interventions / Medication management ? ?Chest pain, likely noncardiac given the length of time he has had active symptoms without any evidence on EKG or elevation of troponin.  Given normal heart catheterization a number of years ago I doubt that this is an acute obstructive coronary disease without any exertional symptoms ?I have reviewed the patients home medicines and have made adjustments as needed ? ? ?Social Determinants of Health: ? ?None ? ? ?Test / Admission - Considered: ? ?Considered admission but low risk for ACS or other causes of CP ? ? ? ? ? ? ? ? ?Final Clinical Impression(s) / ED Diagnoses ?Final diagnoses:  ?Chest pain, unspecified type  ? ? ?Rx / DC Orders ?ED Discharge Orders   ? ? None  ? ?  ? ? ?  ?Eber HongMiller, Alizey Noren, MD ?02/28/22 2104 ? ?

## 2022-03-08 ENCOUNTER — Other Ambulatory Visit: Payer: No Typology Code available for payment source

## 2022-03-16 ENCOUNTER — Ambulatory Visit: Payer: PRIVATE HEALTH INSURANCE | Admitting: Urology

## 2022-12-15 ENCOUNTER — Other Ambulatory Visit: Payer: Self-pay

## 2022-12-15 ENCOUNTER — Emergency Department (HOSPITAL_COMMUNITY): Payer: No Typology Code available for payment source

## 2022-12-15 ENCOUNTER — Encounter (HOSPITAL_COMMUNITY): Payer: Self-pay | Admitting: *Deleted

## 2022-12-15 ENCOUNTER — Emergency Department (HOSPITAL_COMMUNITY)
Admission: EM | Admit: 2022-12-15 | Discharge: 2022-12-15 | Disposition: A | Discharge status: Home / Self Care | Payer: No Typology Code available for payment source | Attending: Emergency Medicine | Admitting: Emergency Medicine

## 2022-12-15 DIAGNOSIS — R059 Cough, unspecified: Secondary | ICD-10-CM | POA: Diagnosis present

## 2022-12-15 DIAGNOSIS — Z1152 Encounter for screening for COVID-19: Secondary | ICD-10-CM | POA: Diagnosis not present

## 2022-12-15 DIAGNOSIS — J101 Influenza due to other identified influenza virus with other respiratory manifestations: Secondary | ICD-10-CM | POA: Insufficient documentation

## 2022-12-15 DIAGNOSIS — I10 Essential (primary) hypertension: Secondary | ICD-10-CM | POA: Insufficient documentation

## 2022-12-15 DIAGNOSIS — Z79899 Other long term (current) drug therapy: Secondary | ICD-10-CM | POA: Diagnosis not present

## 2022-12-15 LAB — GROUP A STREP BY PCR: Group A Strep by PCR: NOT DETECTED

## 2022-12-15 LAB — RESP PANEL BY RT-PCR (RSV, FLU A&B, COVID)  RVPGX2
Influenza A by PCR: NEGATIVE
Influenza B by PCR: POSITIVE — AB
Resp Syncytial Virus by PCR: NEGATIVE
SARS Coronavirus 2 by RT PCR: NEGATIVE

## 2022-12-15 MED ORDER — FAMOTIDINE IN NACL 20-0.9 MG/50ML-% IV SOLN
20.0000 mg | Freq: Once | INTRAVENOUS | Status: AC
  Administered 2022-12-15: 20 mg via INTRAVENOUS
  Filled 2022-12-15: qty 50

## 2022-12-15 MED ORDER — OSELTAMIVIR PHOSPHATE 75 MG PO CAPS: 75.0000 mg | ORAL_CAPSULE | Freq: Two times a day (BID) | ORAL | 0 refills | Status: AC

## 2022-12-15 MED ORDER — LIDOCAINE VISCOUS HCL 2 % MT SOLN
15.0000 mL | Freq: Once | OROMUCOSAL | Status: AC
  Administered 2022-12-15: 15 mL via ORAL
  Filled 2022-12-15: qty 15

## 2022-12-15 MED ORDER — AZITHROMYCIN 250 MG PO TABS: 250.0000 mg | ORAL_TABLET | Freq: Every day | ORAL | 0 refills | Status: AC

## 2022-12-15 MED ORDER — ALUM & MAG HYDROXIDE-SIMETH 200-200-20 MG/5ML PO SUSP
30.0000 mL | Freq: Once | ORAL | Status: AC
  Administered 2022-12-15: 30 mL via ORAL
  Filled 2022-12-15: qty 30

## 2022-12-15 MED ORDER — SUCRALFATE 1 GM/10ML PO SUSP: 1.0000 g | Freq: Three times a day (TID) | ORAL | 0 refills | Status: AC

## 2022-12-15 NOTE — ED Triage Notes (Signed)
Pt with coughing since yesterday, coughed up some blood yesterday x 1, none today.  Productive cough today yellow phlegm today.  Fever the other night. + sore throat.  +chills.  C/o back pain today.  Denies any known sick contact. Burns with eating, hx of acid reflux, currently on medication for reflux.

## 2022-12-15 NOTE — ED Provider Notes (Signed)
[facility] Provider Note   CSN: [id number] Arrival date & time: 12/15/22  1607     History  Chief Complaint  Patient presents with   Cough    Marco Martinez is a 42 y.o. male.   Cough Associated symptoms: chills and sore throat   Patient presents for flulike symptoms.  Medical history includes HTN, GERD, depression, anxiety, chronic pain.  This was 2 days ago.  Patient describes sore throat, cough, chills.  Yesterday morning, he woke up and had some bloody mucus in his mouth.  He has not had any presence of blood since then.  He does have a smoking history but denies any chronic lung disease.  He does not require inhaler use.  Over the past 2 days, he has not had shortness of breath.  He does have a history of GERD and does describe burning sensation in his chest when he tries to eat or drink.  He has been taking his omeprazole.  Because of this burning sensation, he has had decreased p.o. intake.  Patient has been taking Tylenol for his flulike symptoms.     Home Medications Prior to Admission medications   Medication Sig Start Date End Date Taking? Authorizing Provider  azithromycin (ZITHROMAX) 250 MG tablet Take 1 tablet (250 mg total) by mouth daily. Take first 2 tablets together, then 1 every day until finished. 12/15/22  Yes Godfrey Pick, MD  oseltamivir (TAMIFLU) 75 MG capsule Take 1 capsule (75 mg total) by mouth every 12 (twelve) hours. Discontinue use if you experience worsening gastrointestinal upset. 12/15/22  Yes Godfrey Pick, MD  sucralfate (CARAFATE) 1 GM/10ML suspension Take 10 mLs (1 g total) by mouth with breakfast, with lunch, and with evening meal. 12/15/22  Yes Godfrey Pick, MD  atorvastatin (LIPITOR) 10 MG tablet SMARTSIG:1 Tablet(s) By Mouth Every Evening 09/11/20   [provider]  clomiPHENE (CLOMID) 50 MG tablet Take 1 tablet (50 mg total) by mouth daily. 11/05/20   Franchot Gallo, MD  losartan (COZAAR) 50  MG tablet Take 1 tablet by mouth daily. 03/03/21   [provider]  omeprazole (PRILOSEC) 10 MG capsule Take 10 mg by mouth daily.    [provider]      Allergies    Patient has no known allergies.    Review of Systems   Review of Systems  Constitutional:  Positive for appetite change and chills.  HENT:  Positive for sore throat.   Respiratory:  Positive for cough.   All other systems reviewed and are negative.   Physical Exam Updated Vital Signs BP (!) 137/97 (BP Location: Right Arm)   Pulse 96   Temp 97.6 F (36.4 C) (Oral)   Resp 16   Ht 5' 10"$  (1.778 m)   Wt 127 kg   SpO2 95%   BMI 40.18 kg/m  Physical Exam Vitals and nursing note reviewed.  Constitutional:      General: He is not in acute distress.    Appearance: Normal appearance. He is well-developed. He is not ill-appearing, toxic-appearing or diaphoretic.  HENT:     Head: Normocephalic and atraumatic.     Right Ear: External ear normal.     Left Ear: External ear normal.     Nose: Nose normal.     Mouth/Throat:     Mouth: Mucous membranes are moist.  Eyes:     Extraocular Movements: Extraocular movements intact.     Conjunctiva/sclera: Conjunctivae normal.  Cardiovascular:  Rate and Rhythm: Normal rate and regular rhythm.  Pulmonary:     Effort: Pulmonary effort is normal. No respiratory distress.     Breath sounds: Normal breath sounds. No wheezing, rhonchi or rales.  Abdominal:     General: There is no distension.     Palpations: Abdomen is soft.  Musculoskeletal:        General: No swelling. Normal range of motion.     Cervical back: Normal range of motion and neck supple.  Skin:    General: Skin is warm and dry.     Coloration: Skin is not jaundiced or pale.  Neurological:     General: No focal deficit present.     Mental Status: He is alert and oriented to person, place, and time.     Cranial Nerves: No cranial nerve deficit.     Sensory: No sensory deficit.     Motor:  No weakness.     Coordination: Coordination normal.  Psychiatric:        Mood and Affect: Mood normal.        Behavior: Behavior normal.        Thought Content: Thought content normal.        Judgment: Judgment normal.     ED Results / Procedures / Treatments   Labs (all labs ordered are listed, but only abnormal results are displayed) Labs Reviewed  RESP PANEL BY RT-PCR (RSV, FLU A&B, COVID)  [id number] - Abnormal; Notable for the following components:      Result Value   Influenza B by PCR POSITIVE (*)    All other components within normal limits  GROUP A STREP BY PCR    EKG None  Radiology DG Chest 2 View  Result Date: 12/15/2022 CLINICAL DATA:  Cough and chills EXAM: CHEST - 2 VIEW COMPARISON:  02/28/2022 FINDINGS: Midline trachea. Normal heart size and mediastinal contours. No pleural effusion or pneumothorax. There is new pulmonary interstitial thickening. No lobar consolidation. IMPRESSION: New pulmonary interstitial thickening. This could simply be due to patient body habitus and smoking history. Viral or mycoplasma pneumonia felt less likely. Electronically Signed   By: Abigail Miyamoto M.D.   On: 12/15/2022 17:31    Procedures Procedures    Medications Ordered in ED Medications  alum & mag hydroxide-simeth (MAALOX/MYLANTA) 200-200-20 MG/5ML suspension 30 mL (has no administration in time range)    And  lidocaine (XYLOCAINE) 2 % viscous mouth solution 15 mL (has no administration in time range)  famotidine (PEPCID) IVPB 20 mg premix (has no administration in time range)    ED Course/ Medical Decision Making/ A&P                             Medical Decision Making Amount and/or Complexity of Data Reviewed Radiology: ordered.  Risk OTC drugs. Prescription drug management.   Patient presents for leg symptoms for the past 2 days.  Prior to being bedded in the ED, diagnostic workup was initiated.  Patient was found be positive for influenza B.  On assessment, patient  is well-appearing.  Breathing is unlabored and lungs are clear to auscultation.  He denies any recent shortness of breath.  He has had a productive cough chest x-ray shows nonspecific interstitial thickening.  Patient be treated for atypical pneumonia.  I talked him about Tamiflu.  He has had recent diarrhea and stomach upset.  He was advised on possible side effects of Tamiflu.  He is  agreeable to initiate Tamiflu and discontinue if GI symptoms worsen.  Patient also describes recent worsening of reflux.  He describes as a burning sensation anytime he tries to eat or drink.  He is currently on omeprazole.  Will provide GI cocktail while in the ED. patient had improved symptoms following GI cocktail.  Patient to be prescribed Carafate to help with his p.o. intake.  He is stable for discharge.        Final Clinical Impression(s) / ED Diagnoses Final diagnoses:  Influenza B    Rx / DC Orders ED Discharge Orders          Ordered    sucralfate (CARAFATE) 1 GM/10ML suspension  3 times daily with meals        12/15/22 1843    oseltamivir (TAMIFLU) 75 MG capsule  Every 12 hours        12/15/22 1843    azithromycin (ZITHROMAX) 250 MG tablet  Daily        12/15/22 1843              Godfrey Pick, MD 12/15/22 1950

## 2022-12-15 NOTE — Discharge Instructions (Addendum)
There were prescriptions that were sent to your pharmacy.  Carafate can help with your reflux.  Take this with meals.  Continue to take your omeprazole.  Tamiflu can help shorten duration of flulike symptoms.  Discontinue use if you experience worsening gastrointestinal upset.  Azithromycin is an antibiotic to treat possible atypical pneumonia.  Take this as prescribed.  Return to the emergency department for any worsening of symptoms.

## 2024-10-11 ENCOUNTER — Other Ambulatory Visit (HOSPITAL_BASED_OUTPATIENT_CLINIC_OR_DEPARTMENT_OTHER): Payer: Self-pay

## 2024-10-11 MED ORDER — LOSARTAN POTASSIUM-HCTZ 50-12.5 MG PO TABS
1.0000 | ORAL_TABLET | Freq: Every day | ORAL | 0 refills | Status: AC
  Filled 2024-10-11: qty 90, 90d supply, fill #0

## 2024-10-11 MED ORDER — ATORVASTATIN CALCIUM 10 MG PO TABS
10.0000 mg | ORAL_TABLET | Freq: Every evening | ORAL | 1 refills | Status: AC
  Filled 2024-10-11: qty 90, 90d supply, fill #0

## 2024-11-30 ENCOUNTER — Other Ambulatory Visit (HOSPITAL_COMMUNITY): Payer: Self-pay
